# Patient Record
Sex: Male | Born: 1940 | Race: Black or African American | Hispanic: No | Smoking: Never smoker
Health system: Southern US, Community
[De-identification: ages and names within clinical notes are randomized; demographics above are authoritative.]

## PROBLEM LIST (undated history)

## (undated) DIAGNOSIS — M5136 Other intervertebral disc degeneration, lumbar region: Secondary | ICD-10-CM

## (undated) DIAGNOSIS — F419 Anxiety disorder, unspecified: Secondary | ICD-10-CM

## (undated) DIAGNOSIS — T8149XA Infection following a procedure, other surgical site, initial encounter: Secondary | ICD-10-CM

## (undated) DIAGNOSIS — E785 Hyperlipidemia, unspecified: Secondary | ICD-10-CM

## (undated) DIAGNOSIS — I10 Essential (primary) hypertension: Secondary | ICD-10-CM

## (undated) DIAGNOSIS — R931 Abnormal findings on diagnostic imaging of heart and coronary circulation: Secondary | ICD-10-CM

## (undated) HISTORY — DX: Hyperlipidemia, unspecified: E78.5

## (undated) HISTORY — DX: Anxiety disorder, unspecified: F41.9

## (undated) HISTORY — DX: Abnormal findings on diagnostic imaging of heart and coronary circulation: R93.1

## (undated) HISTORY — DX: Other intervertebral disc degeneration, lumbar region: M51.36

---

## 2006-04-27 HISTORY — PX: HERNIA REPAIR: SHX51

## 2007-04-14 ENCOUNTER — Other Ambulatory Visit: Payer: Self-pay

## 2007-04-14 ENCOUNTER — Inpatient Hospital Stay: Payer: Self-pay | Admitting: Vascular Surgery

## 2007-04-22 ENCOUNTER — Ambulatory Visit (HOSPITAL_COMMUNITY): Admission: RE | Admit: 2007-04-22 | Discharge: 2007-04-22 | Payer: Self-pay | Admitting: Internal Medicine

## 2007-05-06 ENCOUNTER — Ambulatory Visit (HOSPITAL_COMMUNITY): Admission: RE | Admit: 2007-05-06 | Discharge: 2007-05-06 | Payer: Self-pay | Admitting: Family Medicine

## 2007-05-07 ENCOUNTER — Ambulatory Visit (HOSPITAL_COMMUNITY): Admission: RE | Admit: 2007-05-07 | Discharge: 2007-05-07 | Payer: Self-pay | Admitting: *Deleted

## 2007-06-20 ENCOUNTER — Ambulatory Visit: Payer: Self-pay

## 2007-12-08 ENCOUNTER — Ambulatory Visit: Payer: Self-pay | Admitting: Vascular Surgery

## 2007-12-08 ENCOUNTER — Other Ambulatory Visit: Payer: Self-pay

## 2007-12-22 ENCOUNTER — Inpatient Hospital Stay: Payer: Self-pay | Admitting: Vascular Surgery

## 2008-07-20 IMAGING — CT CT ABCESS DRAINAGE
1 series · 16 of 32 positions shown, 20 images · non-contrast
Comparison: none

CLINICAL DATA: Postoperative change, right flank abscess.
 CT GUIDED RIGHT LOWER QUADRANT ABSCESS DRAIN (4344 hours):
 Procedure: The right lower quadrant was prepped and draped in a sterile fashion. Lidocaine was utilized for local anesthesia.  Under CT guidance, an 18-gauge needle was inserted into the right lower quadrant abscess. It was removed over an Amplatz.  A 12 French drain was inserted and coiled in the abscess cavity.  It was looped and string-fixed, then sewn to the skin.  5 cc frank pus was aspirated and no complications.

[Series 2: abd pelvis · axial · 0.86mm/px · z∈[-142,-2]mm · 16 of 69 slices shown, 20 images]
[im 5/69  soft-tissue]
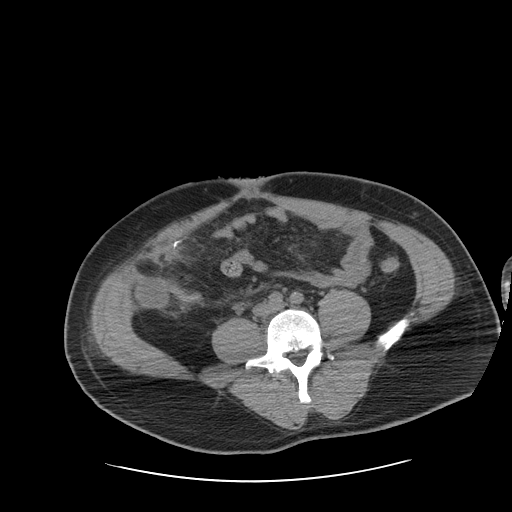
[im 5/69  bone]
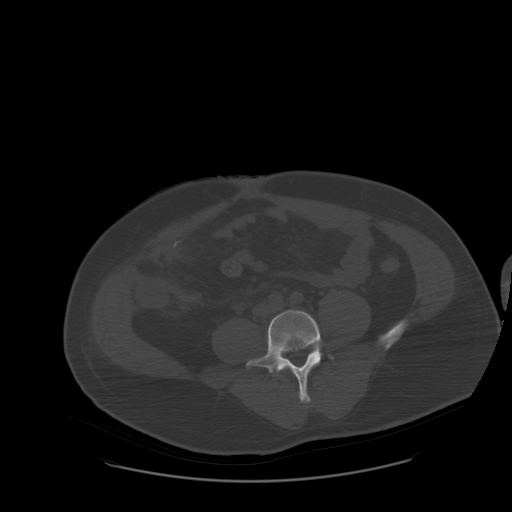
[im 9/69  soft-tissue]
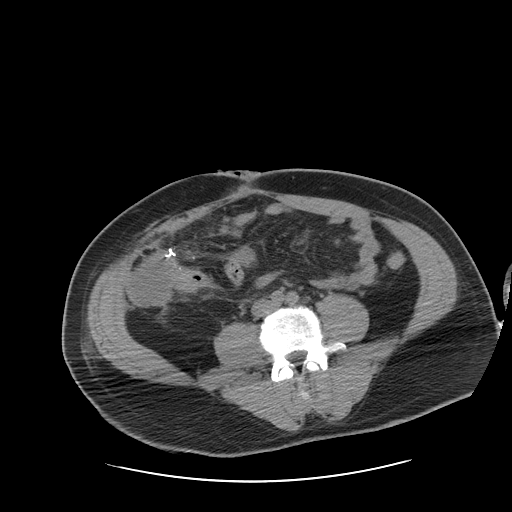
[im 14/69  soft-tissue]
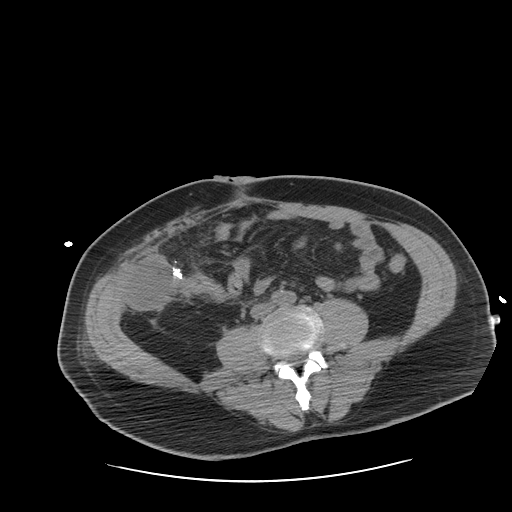
[im 18/69  soft-tissue]
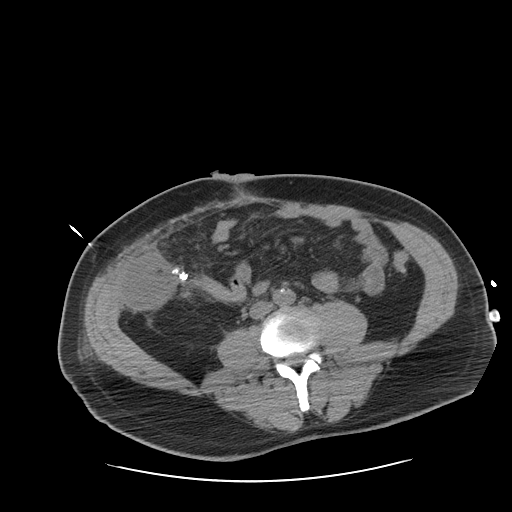
[im 22/69  soft-tissue]
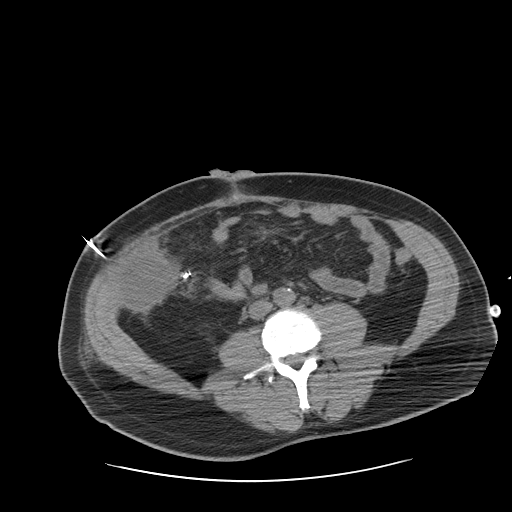
[im 27/69  soft-tissue]
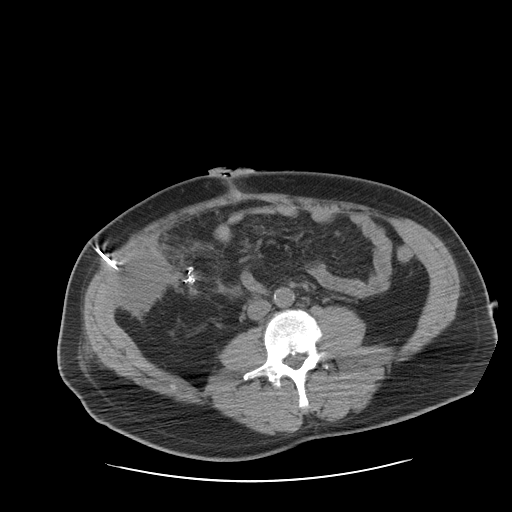
[im 31/69  soft-tissue]
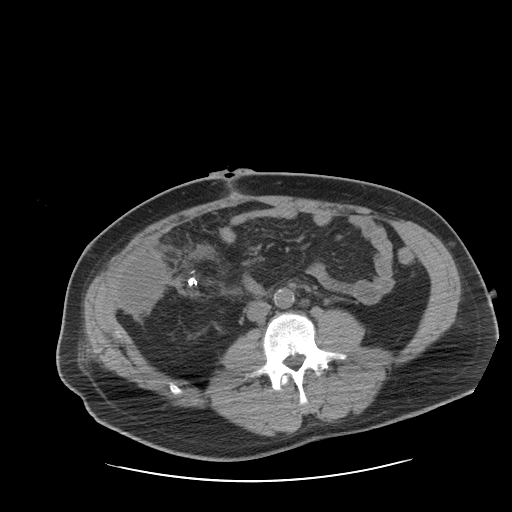
[im 38/69  soft-tissue]
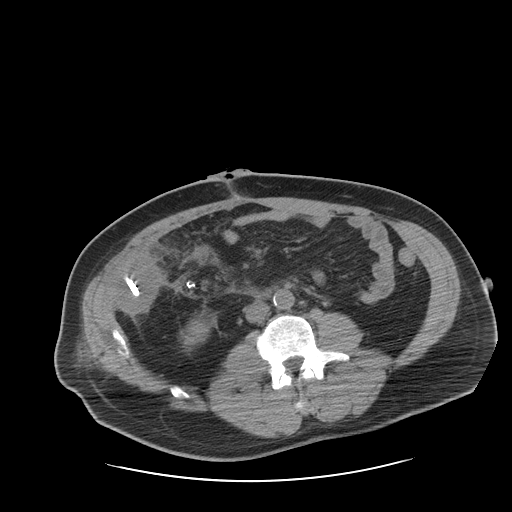
[im 42/69  soft-tissue]
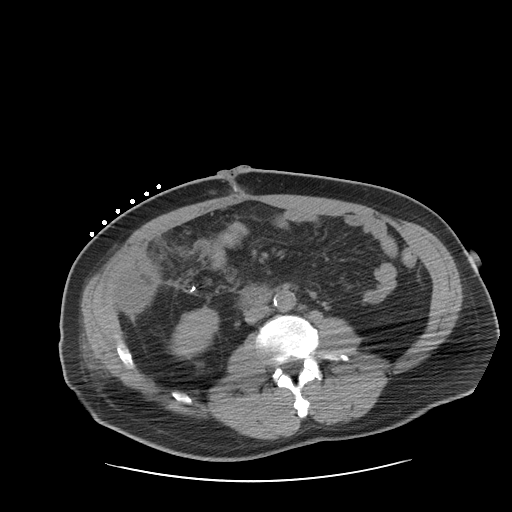
[im 42/69  bone]
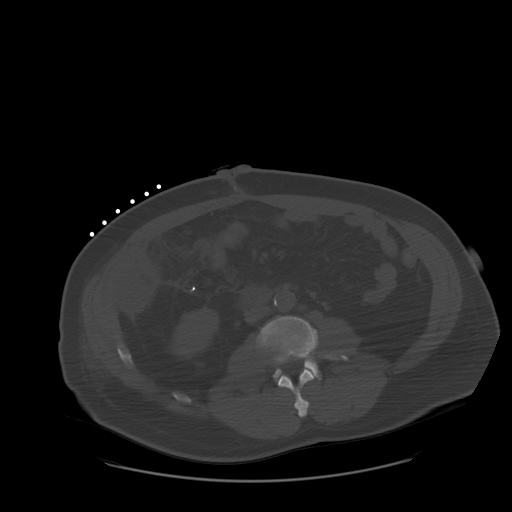
[im 47/69  soft-tissue]
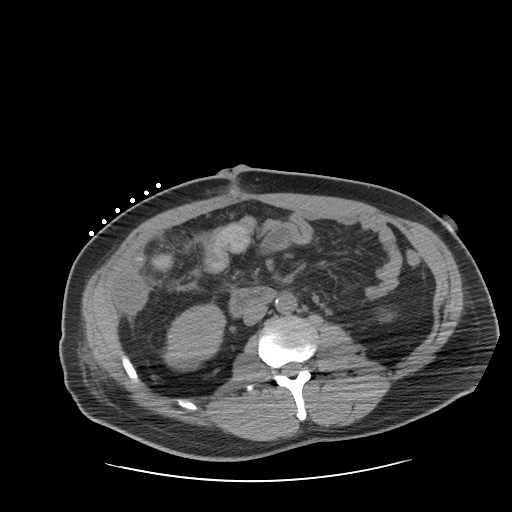
[im 51/69  soft-tissue]
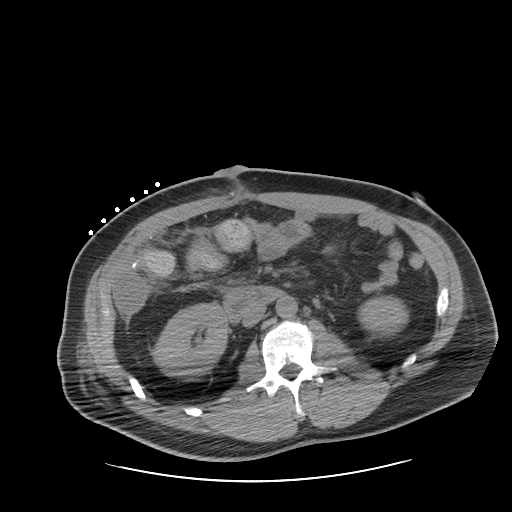
[im 55/69  soft-tissue]
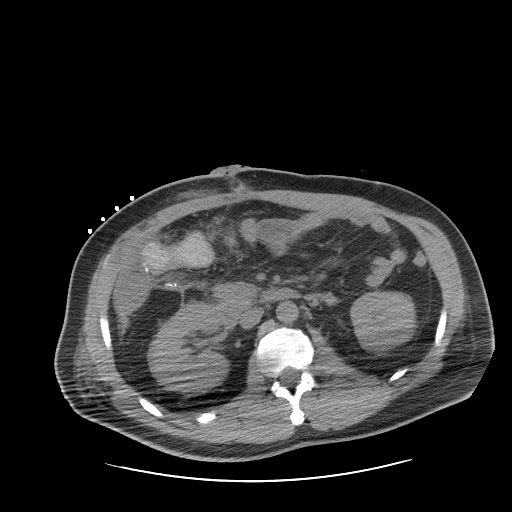
[im 60/69  soft-tissue]
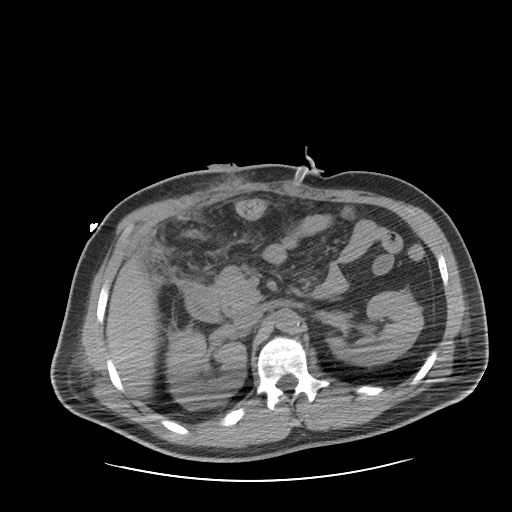
[im 60/69  lung]
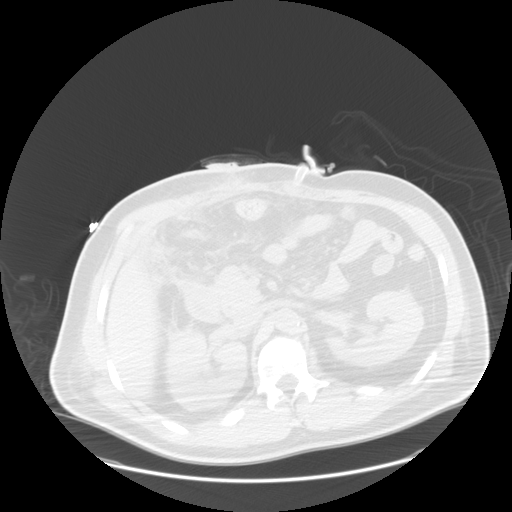
[im 62/69  lung]
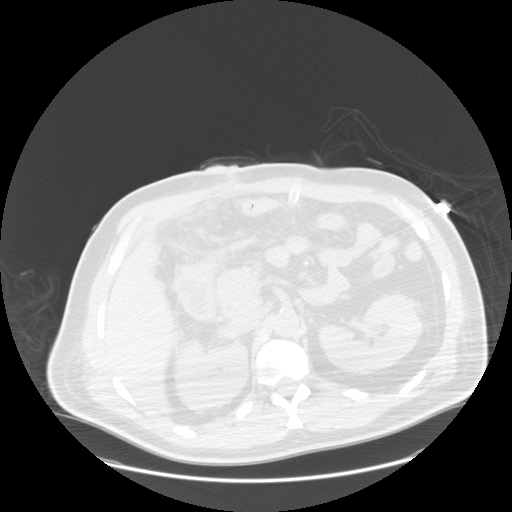
[im 64/69  soft-tissue]
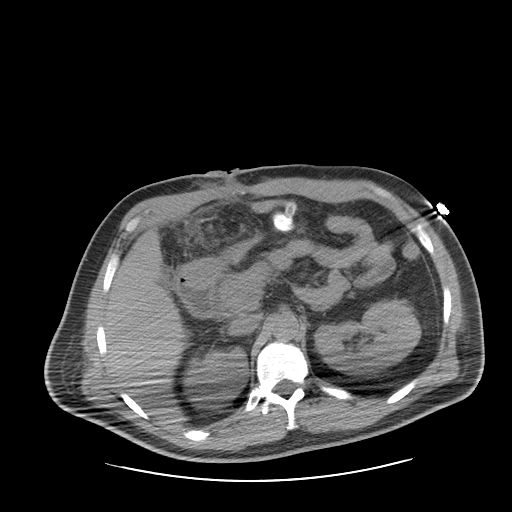
[im 64/69  lung]
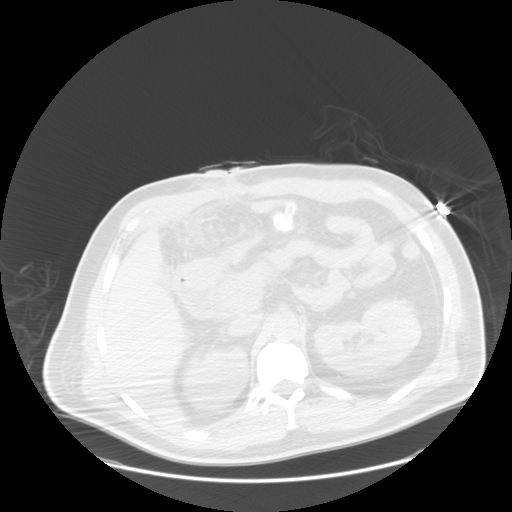
[im 66/69  lung]
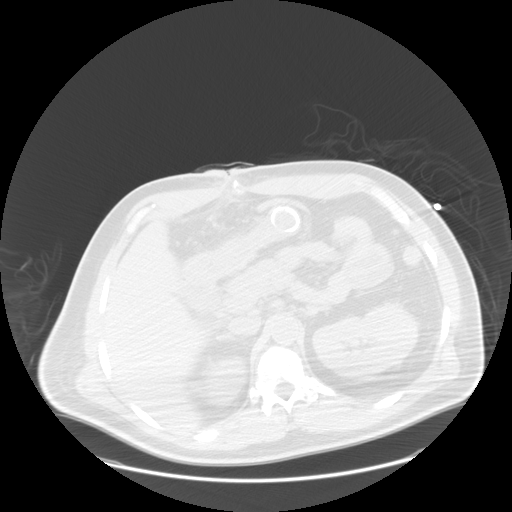

[16 of 32 positions shown; findings below may reference images not displayed]

FINDINGS: images document placement of a 12 French drain in a right lower quadrant abscess.
IMPRESSION: Successful right lower quadrant abscess drain.  The patient will be placed on ciprofloxacin IV.

## 2011-01-15 LAB — CULTURE, ROUTINE-ABSCESS: Culture: NO GROWTH

## 2011-02-19 ENCOUNTER — Ambulatory Visit: Payer: Self-pay | Admitting: Surgery

## 2011-03-03 ENCOUNTER — Ambulatory Visit: Payer: Self-pay | Admitting: Internal Medicine

## 2011-03-06 ENCOUNTER — Inpatient Hospital Stay: Payer: Self-pay | Admitting: Surgery

## 2012-05-22 IMAGING — CR DG ABDOMEN 2V
1 series · 2 of 2 positions shown · non-contrast
Comparison: none

REASON FOR EXAM: Ileus
COMMENTS:

[Series 1: view not recorded · 0.17mm/px · 2 of 2 slices shown]
[im 1/2]
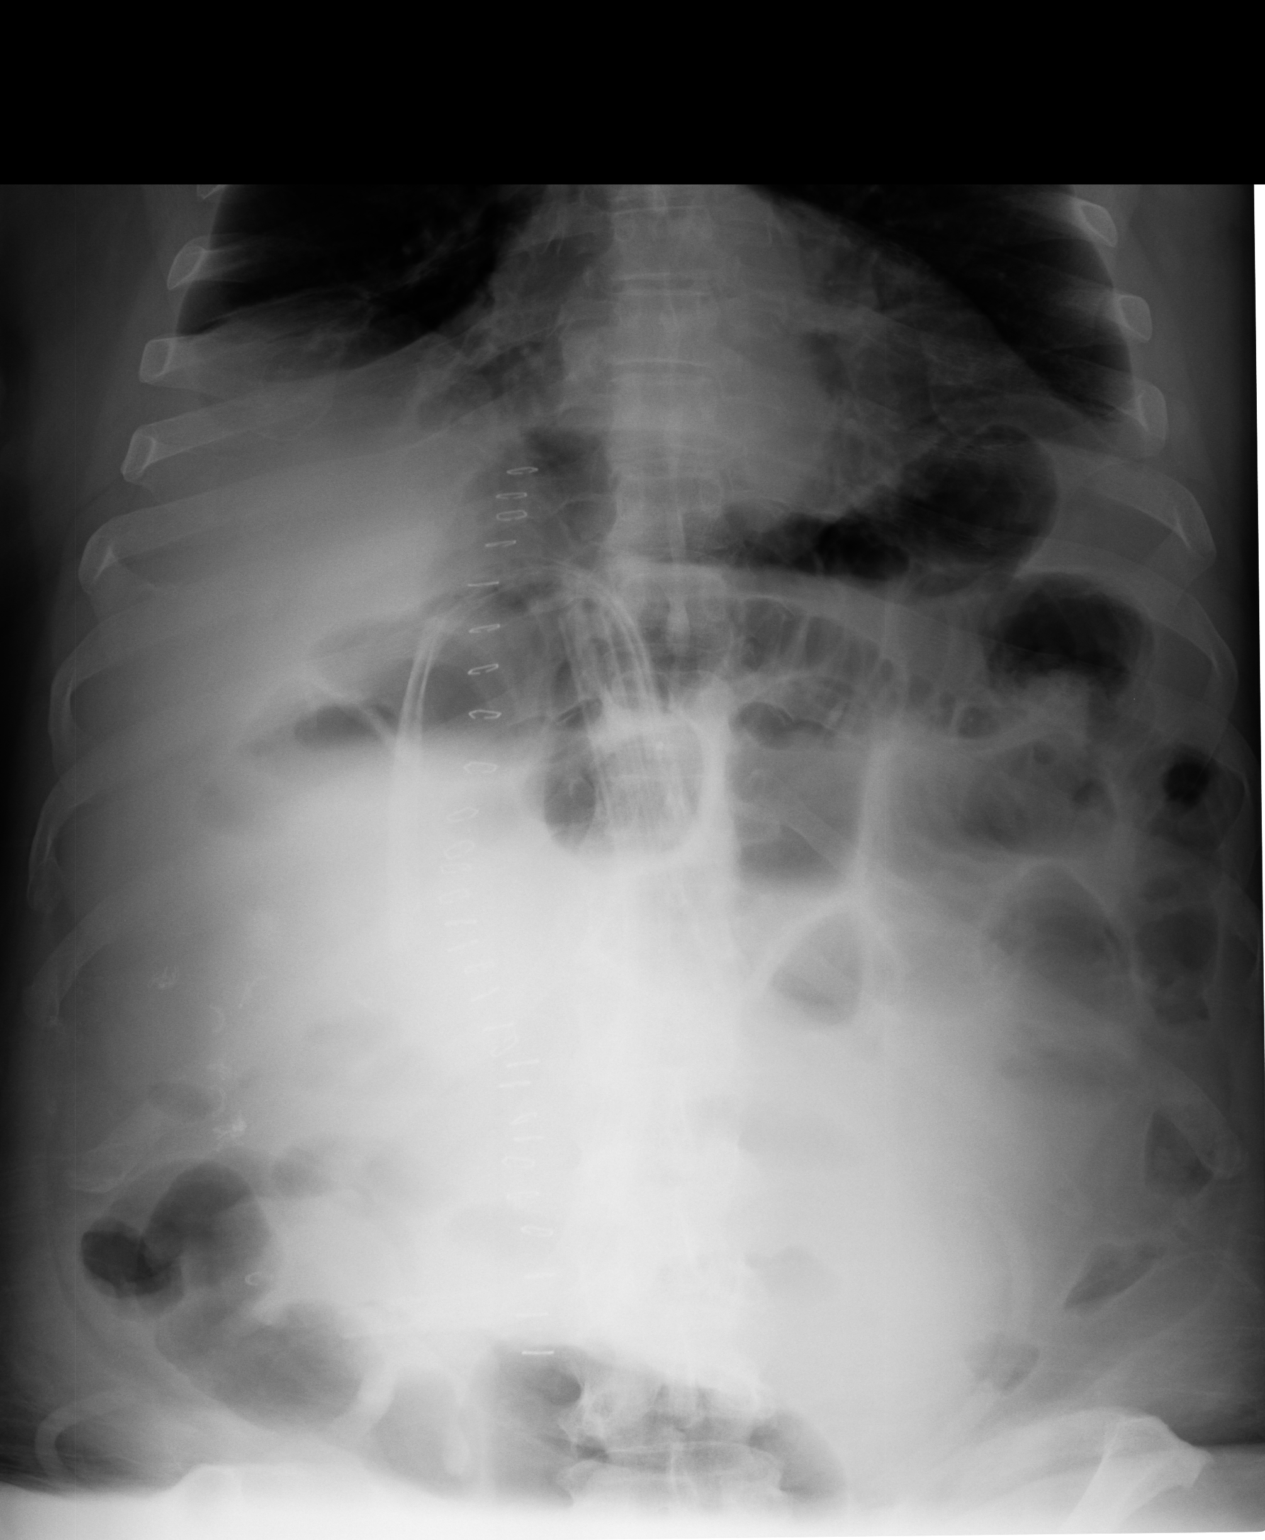
[im 2/2]
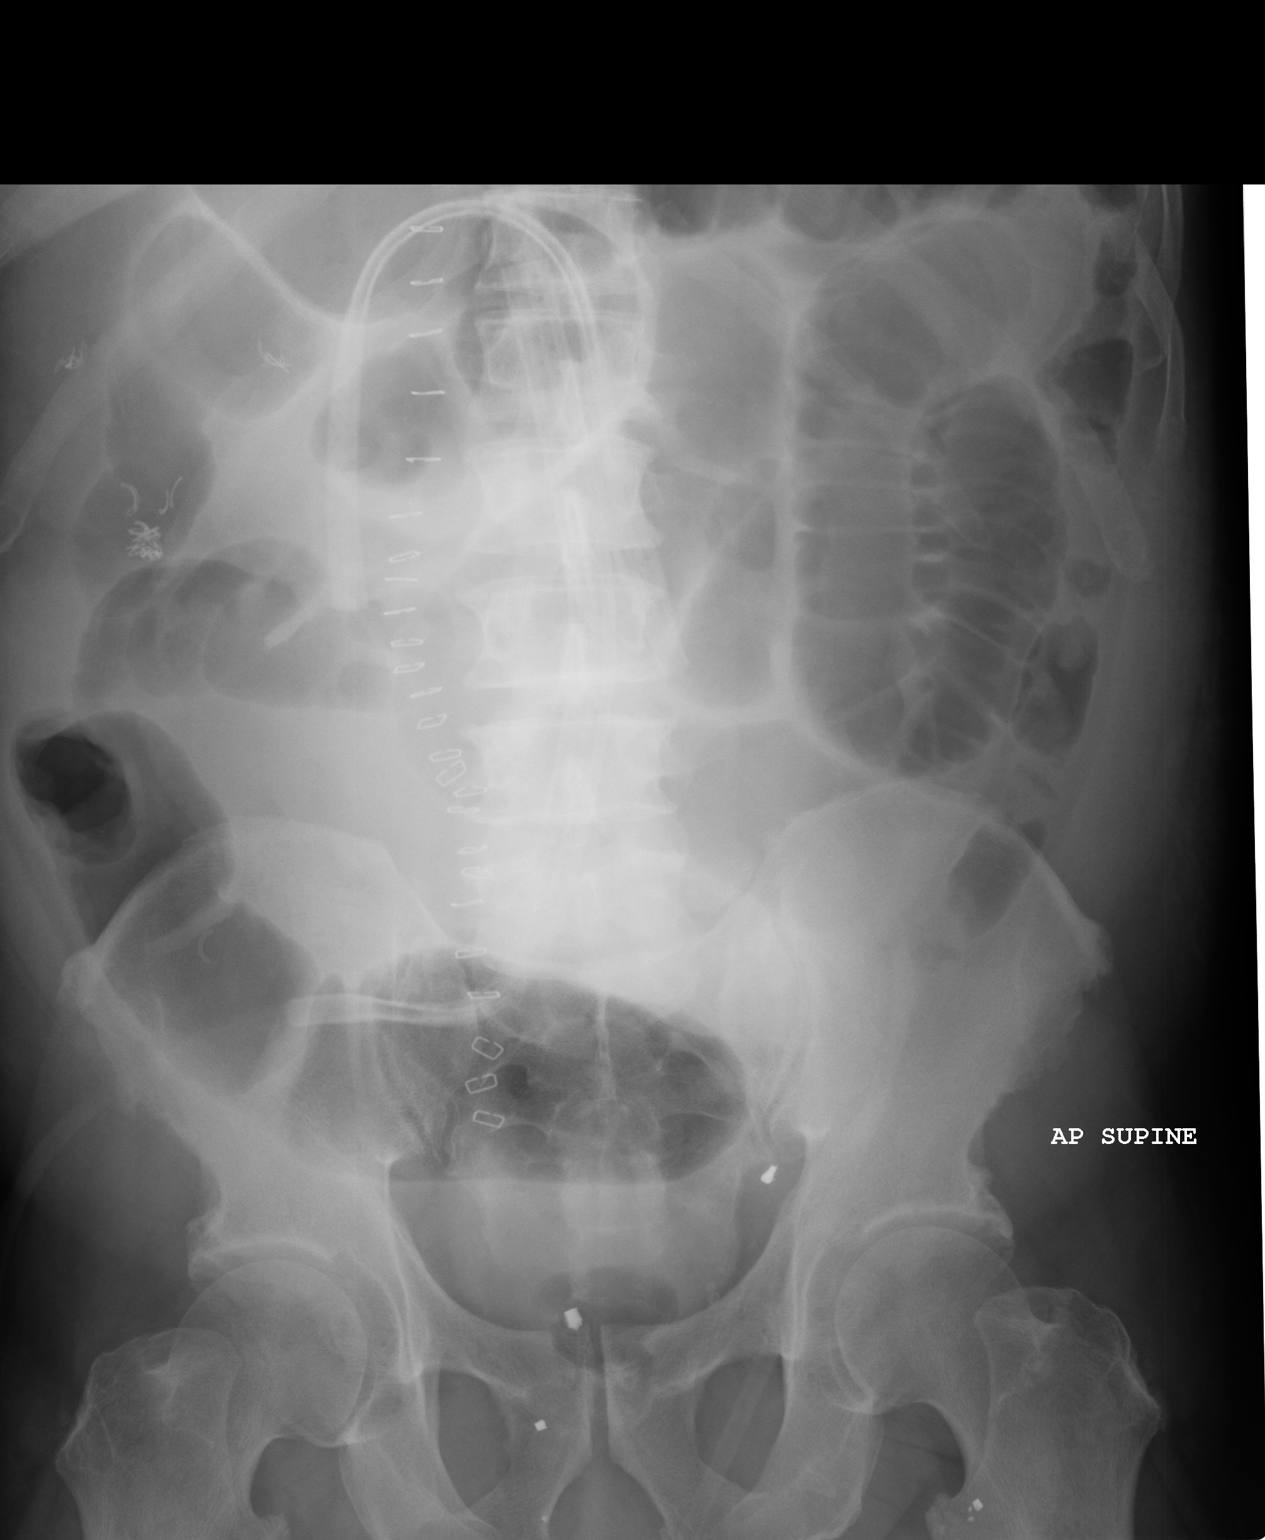

[2 of 2 positions shown; findings below may reference images not displayed]

PROCEDURE:     DXR - DXR ABDOMEN 2 V FLAT AND ERECT  - March 09, 2011  [DATE]

RESULT:     Postoperative metallic clips are noted to the right of midline.
No subdiaphragmatic free air is seen. There is gaseous distention of
multiple loops of small bowel, most compatible with an ileus pattern. Gas is
present in the colon. There is tubing and a possible drain projected over
the upper abdomen. Postoperative metallic clips are present laterally in the
right abdomen.
IMPRESSION: 1. Postoperative changes are noted as mentioned above.
2. There is mild dilatation of multiple loops of small bowel, most
compatible with an ileus pattern.

## 2013-09-20 DIAGNOSIS — M5136 Other intervertebral disc degeneration, lumbar region: Secondary | ICD-10-CM | POA: Insufficient documentation

## 2013-09-20 DIAGNOSIS — E785 Hyperlipidemia, unspecified: Secondary | ICD-10-CM

## 2013-09-20 DIAGNOSIS — I10 Essential (primary) hypertension: Secondary | ICD-10-CM | POA: Insufficient documentation

## 2013-09-20 HISTORY — DX: Other intervertebral disc degeneration, lumbar region: M51.36

## 2013-09-20 HISTORY — DX: Hyperlipidemia, unspecified: E78.5

## 2013-11-12 ENCOUNTER — Emergency Department: Payer: Self-pay | Admitting: Emergency Medicine

## 2014-04-27 DIAGNOSIS — R931 Abnormal findings on diagnostic imaging of heart and coronary circulation: Secondary | ICD-10-CM

## 2014-04-27 HISTORY — DX: Abnormal findings on diagnostic imaging of heart and coronary circulation: R93.1

## 2014-05-06 ENCOUNTER — Emergency Department: Payer: Self-pay | Admitting: Emergency Medicine

## 2014-05-06 LAB — CBC WITH DIFFERENTIAL/PLATELET
BASOS ABS: 0 10*3/uL (ref 0.0–0.1)
Basophil %: 0.5 %
EOS PCT: 1.5 %
Eosinophil #: 0.1 10*3/uL (ref 0.0–0.7)
HCT: 44.7 % (ref 40.0–52.0)
HGB: 14.5 g/dL (ref 13.0–18.0)
LYMPHS ABS: 1.6 10*3/uL (ref 1.0–3.6)
Lymphocyte %: 27.2 %
MCH: 29.6 pg (ref 26.0–34.0)
MCHC: 32.4 g/dL (ref 32.0–36.0)
MCV: 91 fL (ref 80–100)
MONO ABS: 0.7 x10 3/mm (ref 0.2–1.0)
Monocyte %: 11.9 %
NEUTROS PCT: 58.9 %
Neutrophil #: 3.5 10*3/uL (ref 1.4–6.5)
PLATELETS: 271 10*3/uL (ref 150–440)
RBC: 4.89 10*6/uL (ref 4.40–5.90)
RDW: 13.1 % (ref 11.5–14.5)
WBC: 5.9 10*3/uL (ref 3.8–10.6)

## 2014-05-06 LAB — BASIC METABOLIC PANEL
Anion Gap: 6 — ABNORMAL LOW (ref 7–16)
BUN: 13 mg/dL (ref 7–18)
CHLORIDE: 98 mmol/L (ref 98–107)
CO2: 35 mmol/L — AB (ref 21–32)
CREATININE: 0.85 mg/dL (ref 0.60–1.30)
Calcium, Total: 9.4 mg/dL (ref 8.5–10.1)
EGFR (African American): >60
EGFR (Non-African Amer.): >60
Glucose: 109 mg/dL — ABNORMAL HIGH (ref 65–99)
Osmolality: 278 (ref 275–301)
Potassium: 3.5 mmol/L (ref 3.5–5.1)
SODIUM: 139 mmol/L (ref 136–145)

## 2014-12-19 ENCOUNTER — Emergency Department
Admission: EM | Admit: 2014-12-19 | Discharge: 2014-12-19 | Disposition: A | Discharge status: Home / Self Care | Payer: Medicare Other | Attending: Emergency Medicine | Admitting: Emergency Medicine

## 2014-12-19 ENCOUNTER — Encounter: Payer: Self-pay | Admitting: Emergency Medicine

## 2014-12-19 DIAGNOSIS — F329 Major depressive disorder, single episode, unspecified: Secondary | ICD-10-CM

## 2014-12-19 DIAGNOSIS — F32A Depression, unspecified: Secondary | ICD-10-CM

## 2014-12-19 DIAGNOSIS — N400 Enlarged prostate without lower urinary tract symptoms: Secondary | ICD-10-CM | POA: Diagnosis not present

## 2014-12-19 DIAGNOSIS — F419 Anxiety disorder, unspecified: Secondary | ICD-10-CM | POA: Diagnosis not present

## 2014-12-19 DIAGNOSIS — I10 Essential (primary) hypertension: Secondary | ICD-10-CM | POA: Insufficient documentation

## 2014-12-19 DIAGNOSIS — R309 Painful micturition, unspecified: Secondary | ICD-10-CM | POA: Diagnosis present

## 2014-12-19 HISTORY — DX: Essential (primary) hypertension: I10

## 2014-12-19 LAB — URINALYSIS COMPLETE WITH MICROSCOPIC (ARMC ONLY)
BILIRUBIN URINE: NEGATIVE
Bacteria, UA: NONE SEEN
Glucose, UA: NEGATIVE mg/dL
HGB URINE DIPSTICK: NEGATIVE
KETONES UR: NEGATIVE mg/dL
LEUKOCYTES UA: NEGATIVE
NITRITE: NEGATIVE
PH: 7 (ref 5.0–8.0)
Protein, ur: NEGATIVE mg/dL
RBC / HPF: NONE SEEN RBC/hpf (ref 0–5)
Specific Gravity, Urine: 1.004 — ABNORMAL LOW (ref 1.005–1.030)
Squamous Epithelial / LPF: NONE SEEN
WBC, UA: NONE SEEN WBC/hpf (ref 0–5)

## 2014-12-19 LAB — COMPREHENSIVE METABOLIC PANEL
ALT: 21 U/L (ref 17–63)
ANION GAP: 9 (ref 5–15)
AST: 30 U/L (ref 15–41)
Albumin: 4.8 g/dL (ref 3.5–5.0)
Alkaline Phosphatase: 54 U/L (ref 38–126)
BILIRUBIN TOTAL: 1.8 mg/dL — AB (ref 0.3–1.2)
BUN: 9 mg/dL (ref 6–20)
CO2: 30 mmol/L (ref 22–32)
Calcium: 9.7 mg/dL (ref 8.9–10.3)
Chloride: 100 mmol/L — ABNORMAL LOW (ref 101–111)
Creatinine, Ser: 0.89 mg/dL (ref 0.61–1.24)
GFR calc Af Amer: >60 mL/min (ref >60)
Glucose, Bld: 125 mg/dL — ABNORMAL HIGH (ref 65–99)
POTASSIUM: 3.4 mmol/L — AB (ref 3.5–5.1)
Sodium: 139 mmol/L (ref 135–145)
TOTAL PROTEIN: 8.3 g/dL — AB (ref 6.5–8.1)

## 2014-12-19 LAB — CBC
HEMATOCRIT: 45.6 % (ref 40.0–52.0)
Hemoglobin: 14.8 g/dL (ref 13.0–18.0)
MCH: 28.9 pg (ref 26.0–34.0)
MCHC: 32.4 g/dL (ref 32.0–36.0)
MCV: 89.1 fL (ref 80.0–100.0)
Platelets: 259 10*3/uL (ref 150–440)
RBC: 5.12 MIL/uL (ref 4.40–5.90)
RDW: 13 % (ref 11.5–14.5)
WBC: 5.5 10*3/uL (ref 3.8–10.6)

## 2014-12-19 MED ORDER — TAMSULOSIN HCL 0.4 MG PO CAPS: 0.4000 mg | ORAL_CAPSULE | Freq: Every day | ORAL | Status: DC

## 2014-12-19 MED ORDER — DIAZEPAM 5 MG PO TABS
5.0000 mg | ORAL_TABLET | Freq: Once | ORAL | Status: AC
  Administered 2014-12-19: 5 mg via ORAL

## 2014-12-19 MED ORDER — CLONAZEPAM 2 MG PO TABS: 2.0000 mg | ORAL_TABLET | Freq: Two times a day (BID) | ORAL | Status: DC | PRN

## 2014-12-19 MED ORDER — PAROXETINE HCL 10 MG PO TABS: 10.0000 mg | ORAL_TABLET | Freq: Every day | ORAL | Status: DC

## 2014-12-19 MED ORDER — DIAZEPAM 5 MG PO TABS
ORAL_TABLET | ORAL | Status: AC
  Administered 2014-12-19: 5 mg via ORAL
  Filled 2014-12-19: qty 1

## 2014-12-19 NOTE — ED Provider Notes (Signed)
[facility] Emergency Department Provider Note     Time seen: ----------------------------------------- 4:54 PM on 12/19/2014 -----------------------------------------    I have reviewed the triage vital signs and the nursing notes.   HISTORY  Chief Complaint Anxiety    HPI Stanley Baxter. is a 74 y.o. male who presents to ER states he is currently being treated for UTI and since this past week and is having and jittery and nervous feeling. Patient notes that really has been going on for the past month where he feels weak a little off balance and he still having painful urination. Patient is accompanied by a friend who advised that she saw him at the store and seemed confused. He states he has taken all but one of the antibiotics   Past Medical History  Diagnosis Date  . Hypertension     There are no active problems to display for this patient.   Past Surgical History  Procedure Laterality Date  . Hernia repair      Allergies Review of patient's allergies indicates no known allergies.  Social History Social History  Substance Use Topics  . Smoking status: Never Smoker   . Smokeless tobacco: None  . Alcohol Use: No    Review of Systems Constitutional: Negative for fever. Eyes: Negative for visual changes. ENT: Negative for sore throat. Cardiovascular: Negative for chest pain. Respiratory: Negative for shortness of breath. Gastrointestinal: Negative for abdominal pain, vomiting and diarrhea. Genitourinary: Negative for dysuria. Musculoskeletal: Negative for back pain. Skin: Negative for rash. Neurological: Negative for headaches, positive for weakness and balance trouble Psychiatric: Positive for anxiety and nervousness  10-point ROS otherwise negative.  ____________________________________________   PHYSICAL EXAM:  VITAL SIGNS: ED Triage Vitals  Enc Vitals Group     BP 12/19/14 1139 142/64 mmHg     Pulse Rate  12/19/14 1139 80     Resp 12/19/14 1139 18     Temp 12/19/14 1139 98.7 F (37.1 C)     Temp Source 12/19/14 1139 Oral     SpO2 12/19/14 1139 99 %     Weight 12/19/14 1139 190 lb (86.183 kg)     Height 12/19/14 1139  (1.651 m)     Head Cir --      Peak Flow --      Pain Score --      Pain Loc --      Pain Edu? --      Excl. in GC? --     Constitutional: Alert and oriented. Well appearing and in no distress. Eyes: Conjunctivae are normal. PERRL. Normal extraocular movements. ENT   Head: Normocephalic and atraumatic.   Nose: No congestion/rhinnorhea.   Mouth/Throat: Mucous membranes are moist.   Neck: No stridor. Cardiovascular: Normal rate, regular rhythm. Normal and symmetric distal pulses are present in all extremities. No murmurs, rubs, or gallops. Respiratory: Normal respiratory effort without tachypnea nor retractions. Breath sounds are clear and equal bilaterally. No wheezes/rales/rhonchi. Gastrointestinal: Soft and nontender. No distention. No abdominal bruits.  Musculoskeletal: Nontender with normal range of motion in all extremities. No joint effusions.  No lower extremity tenderness nor edema. Neurologic:  Normal speech and language. No gross focal neurologic deficits are appreciated. Speech is normal. No gait instability. Skin:  Skin is warm, dry and intact. No rash noted. Psychiatric: Depressed mood and affect.  ____________________________________________  ED COURSE:  Pertinent labs & imaging results that were available during my care of the patient were reviewed by me and  considered in my medical decision making (see chart for details). We'll check basic labs, patient will need a medication adjustment and psychiatric follow-up. ____________________________________________    LABS (pertinent positives/negatives)  Labs Reviewed  URINALYSIS COMPLETEWITH MICROSCOPIC ([facility] ONLY) - Abnormal; Notable for the following:    Color, Urine STRAW (*)     APPearance CLEAR (*)    Specific Gravity, Urine 1.004 (*)    All other components within normal limits  COMPREHENSIVE METABOLIC PANEL - Abnormal; Notable for the following:    Potassium 3.4 (*)    Chloride 100 (*)    Glucose, Bld 125 (*)    Total Protein 8.3 (*)    Total Bilirubin 1.8 (*)    All other components within normal limits  CBC   ____________________________________________  FINAL ASSESSMENT AND PLAN  Anxiety and depression, BPH  Plan: Patient with labs and imaging as dictated above. Patient with anxiety and depression. No current UTI. Will be placed on Paxil and will increase his Klonopin as needed. He is encouraged to have follow-up with his doctor the next week for reevaluation. He also seems to have some BPH as he describes urinary hesitancy. Will place on Flomax as well.   Emily Filbert, MD   Emily Filbert, MD 12/19/14 [phone]

## 2014-12-19 NOTE — ED Notes (Addendum)
Pt states he is currently being treated for a UTI, states since this past weekend he has been having "jittering and nervous feeling", also states he is still having some lower back pain, denies any painful urination, pt is accompanied by friend and friend advised that she saw pt at the store and he was shaky and crying and seemed to be confused

## 2014-12-19 NOTE — Discharge Instructions (Signed)
Benign Prostatic Hyperplasia An enlarged prostate (benign prostatic hyperplasia) is common in older men. You may experience the following:  Weak urine stream.  Dribbling.  Feeling like the bladder has not emptied completely.  Difficulty starting urination.  Getting up frequently at night to urinate.  Urinating more frequently during the day. HOME CARE INSTRUCTIONS  Monitor your prostatic hyperplasia for any changes. The following actions may help to alleviate any discomfort you are experiencing:  Give yourself time when you urinate.  Stay away from alcohol.  Avoid beverages containing caffeine, such as coffee, tea, and colas, because they can make the problem worse.  Avoid decongestants, antihistamines, and some prescription medicines that can make the problem worse.  Follow up with your health care provider for further treatment as recommended. SEEK MEDICAL CARE IF:  You are experiencing progressive difficulty voiding.  Your urine stream is progressively getting narrower.  You are awaking from sleep with the urge to void more frequently.  You are constantly feeling the need to void.  You experience loss of urine, especially in small amounts. SEEK IMMEDIATE MEDICAL CARE IF:   You develop increased pain with urination or are unable to urinate.  You develop severe abdominal pain, vomiting, a high fever, or fainting.  You develop back pain or blood in your urine. MAKE SURE YOU:   Understand these instructions.  Will watch your condition.  Will get help right away if you are not doing well or get worse. Document Released: 04/13/2005 Document Revised: 12/14/2012 Document Reviewed: 09/13/2012 [facility] Patient Information 2015 [place], [place]. This information is not intended to replace advice given to you by your health care provider. Make sure you discuss any questions you have with your health care provider.  Depression Depression is feeling sad, low, down in the  dumps, blue, gloomy, or empty. In general, there are two kinds of depression:  Normal sadness or grief. This can happen after something upsetting. It often goes away on its own within 2 weeks. After losing a loved one (bereavement), normal sadness and grief may last longer than two weeks. It usually gets better with time.  Clinical depression. This kind lasts longer than normal sadness or grief. It keeps you from doing the things you normally do in life. It is often hard to function at home, work, or at school. It may affect your relationships with others. Treatment is often needed. GET HELP RIGHT AWAY IF:  You have thoughts about hurting yourself or others.  You lose touch with reality (psychotic symptoms). You may:  See or hear things that are not real.  Have untrue beliefs about your life or people around you.  Your medicine is giving you problems. MAKE SURE YOU:  Understand these instructions.  Will watch your condition.  Will get help right away if you are not doing well or get worse. Document Released: 05/16/2010 Document Revised: 08/28/2013 Document Reviewed: 08/13/2011 [facility] Patient Information 2015 [place], [place]. This information is not intended to replace advice given to you by your health care provider. Make sure you discuss any questions you have with your health care provider.

## 2015-10-03 ENCOUNTER — Emergency Department: Payer: Medicare Other

## 2015-10-03 ENCOUNTER — Encounter: Payer: Self-pay | Admitting: *Deleted

## 2015-10-03 ENCOUNTER — Emergency Department
Admission: EM | Admit: 2015-10-03 | Discharge: 2015-10-03 | Disposition: A | Discharge status: Home / Self Care | Payer: Medicare Other | Attending: Emergency Medicine | Admitting: Emergency Medicine

## 2015-10-03 DIAGNOSIS — Z79899 Other long term (current) drug therapy: Secondary | ICD-10-CM | POA: Diagnosis not present

## 2015-10-03 DIAGNOSIS — I10 Essential (primary) hypertension: Secondary | ICD-10-CM | POA: Diagnosis not present

## 2015-10-03 DIAGNOSIS — J189 Pneumonia, unspecified organism: Secondary | ICD-10-CM | POA: Diagnosis not present

## 2015-10-03 DIAGNOSIS — R0789 Other chest pain: Secondary | ICD-10-CM | POA: Diagnosis present

## 2015-10-03 LAB — BASIC METABOLIC PANEL
ANION GAP: 6 (ref 5–15)
BUN: 15 mg/dL (ref 6–20)
CALCIUM: 8.9 mg/dL (ref 8.9–10.3)
CO2: 30 mmol/L (ref 22–32)
Chloride: 103 mmol/L (ref 101–111)
Creatinine, Ser: 0.88 mg/dL (ref 0.61–1.24)
GLUCOSE: 108 mg/dL — AB (ref 65–99)
Potassium: 4.3 mmol/L (ref 3.5–5.1)
Sodium: 139 mmol/L (ref 135–145)

## 2015-10-03 LAB — CBC
HEMATOCRIT: 41.5 % (ref 40.0–52.0)
HEMOGLOBIN: 13.7 g/dL (ref 13.0–18.0)
MCH: 30 pg (ref 26.0–34.0)
MCHC: 33 g/dL (ref 32.0–36.0)
MCV: 91.1 fL (ref 80.0–100.0)
Platelets: 227 10*3/uL (ref 150–440)
RBC: 4.56 MIL/uL (ref 4.40–5.90)
RDW: 13.5 % (ref 11.5–14.5)
WBC: 5.5 10*3/uL (ref 3.8–10.6)

## 2015-10-03 LAB — TROPONIN I: Troponin I: <0.03 ng/mL (ref <0.031)

## 2015-10-03 MED ORDER — DOXYCYCLINE HYCLATE 100 MG PO TABS
100.0000 mg | ORAL_TABLET | Freq: Once | ORAL | Status: AC
  Administered 2015-10-03: 100 mg via ORAL
  Filled 2015-10-03: qty 1

## 2015-10-03 NOTE — ED Notes (Signed)
Pt complains of chest tightness starting yesterday, pt reports vomiting yesterday and last night

## 2015-10-03 NOTE — Discharge Instructions (Signed)
Community-Acquired Pneumonia, Adult Pneumonia is an infection of the lungs. One type of pneumonia can happen while a person is in a hospital. A different type can happen when a person is not in a hospital (community-acquired pneumonia). It is easy for this kind to spread from person to person. It can spread to you if you breathe near an infected person who coughs or sneezes. Some symptoms include:  A dry cough.  A wet (productive) cough.  Fever.  Sweating.  Chest pain. HOME CARE  Take over-the-counter and prescription medicines only as told by your doctor.  Only take cough medicine if you are losing sleep.  If you were prescribed an antibiotic medicine, take it as told by your doctor. Do not stop taking the antibiotic even if you start to feel better.  Sleep with your head and neck raised (elevated). You can do this by putting a few pillows under your head, or you can sleep in a recliner.  Do not use tobacco products. These include cigarettes, chewing tobacco, and e-cigarettes. If you need help quitting, ask your doctor.  Drink enough water to keep your pee (urine) clear or pale yellow. A shot (vaccine) can help prevent pneumonia. Shots are often suggested for:  People older than 74 years of age.  People older than 74 years of age:  Who are having cancer treatment.  Who have long-term (chronic) lung disease.  Who have problems with their body's defense system (immune system). You may also prevent pneumonia if you take these actions:  Get the flu (influenza) shot every year.  Go to the dentist as often as told.  Wash your hands often. If soap and water are not available, use hand sanitizer. GET HELP IF:  You have a fever.  You lose sleep because your cough medicine does not help. GET HELP RIGHT AWAY IF:  You are short of breath and it gets worse.  You have more chest pain.  Your sickness gets worse. This is very serious if:  You are an older adult.  Your  body's defense system is weak.  You cough up blood.   This information is not intended to replace advice given to you by your health care provider. Make sure you discuss any questions you have with your health care provider.   Document Released: 09/30/2007 Document Revised: 01/02/2015 Document Reviewed: 08/08/2014 Elsevier Interactive Patient Education 2016 [employer]  Use the albuterol inhaler 2 puffs 4 times a day with a spacer until your cough is better.  Try the doxycycline antibiotic one pill twice a day for 10 days.  [facility] and increasing cough or shortness of breath or feeling sicker. Have your doctor check on you again in 2-3 days to see how you're doing.

## 2015-10-03 NOTE — ED Provider Notes (Signed)
[facility] Emergency Department Deuntae Kocsis Note   ____________________________________________  Time seen: Approximately 8:46 AM  I have reviewed the triage vital signs and the nursing notes.   HISTORY  Chief Complaint Chest Pain    HPI Even Budlong. is a 75 y.o. male complains of chest tightness since yesterday. He was recently diagnosed with pneumonia at the [place] and treated with Z-Pak but his chest is becoming somewhat tightness coughing up thick yellow phlegm. Not running a fever. The chest tightness been fairly constant since it started. Made worse by anything particularly.   Past Medical History  Diagnosis Date  . Hypertension     There are no active problems to display for this patient.   Past Surgical History  Procedure Laterality Date  . Hernia repair      Current Outpatient Rx  Name  Route  Sig  Dispense  Refill  . clonazePAM (KLONOPIN) 2 MG tablet   Oral   Take 1 tablet (2 mg total) by mouth 2 (two) times daily as needed for anxiety.   20 tablet   0   . lisinopril (PRINIVIL,ZESTRIL) 20 MG tablet   Oral   Take 10 mg by mouth daily.         . pregabalin (LYRICA) 150 MG capsule   Oral   Take 150 mg by mouth 2 (two) times daily.           Allergies Review of patient's allergies indicates no known allergies.  No family history on file.  Social History Social History  Substance Use Topics  . Smoking status: Never Smoker   . Smokeless tobacco: None  . Alcohol Use: No    Review of Systems Constitutional: No fever/chills Eyes: No visual changes. ENT: No sore throat. Cardiovascular: See history of present illness Respiratory: Denies shortness of breath. Gastrointestinal: No abdominal pain.  No nausea, no vomiting.  No diarrhea.  No constipation. Genitourinary: Negative for dysuria. Musculoskeletal: Negative for back pain. Skin: Negative for rash. Neurological: Negative for headaches, focal weakness or  numbness.  10-point ROS otherwise negative.  ____________________________________________   PHYSICAL EXAM:  VITAL SIGNS: ED Triage Vitals  Enc Vitals Group     BP 10/03/15 0715 138/73 mmHg     Pulse Rate 10/03/15 0715 78     Resp 10/03/15 0715 18     Temp 10/03/15 0715 98 F (36.7 C)     Temp Source 10/03/15 0715 Oral     SpO2 10/03/15 0715 97 %     Weight 10/03/15 0715 190 lb (86.183 kg)     Height 10/03/15 0715  (1.651 m)     Head Cir --      Peak Flow --      Pain Score 10/03/15 0715 2     Pain Loc --      Pain Edu? --      Excl. in GC? --    Constitutional: Alert and oriented. Well appearing and in no acute distress. Eyes: Conjunctivae are normal. PERRL. EOMI. Head: Atraumatic. Nose: No congestion/rhinnorhea. Mouth/Throat: Mucous membranes are moist.  Oropharynx non-erythematous. Neck: No stridor.  Cardiovascular: Normal rate, regular rhythm. Grossly normal heart sounds.  Good peripheral circulation. Respiratory: Normal respiratory effort.  No retractions. Lungs CTAB. Gastrointestinal: Soft and nontender. No distention. No abdominal bruits. No CVA tenderness. }Musculoskeletal: No lower extremity tenderness nor edema.  No joint effusions. Neurologic:  Normal speech and language. No gross focal neurologic deficits are appreciated. No gait instability. Skin:  Skin is warm, dry and intact. No rash noted. Psychiatric: Mood and affect are normal. Speech and behavior are normal.  ____________________________________________   LABS (all labs ordered are listed, but only abnormal results are displayed)  Labs Reviewed  BASIC METABOLIC PANEL - Abnormal; Notable for the following:    Glucose, Bld 108 (*)    All other components within normal limits  CBC  TROPONIN I  TROPONIN I   ____________________________________________  EKG  KG read and interpreted by me shows normal sinus rhythm rate of 79 left axis no acute ST-T wave  changes ____________________________________________  RADIOLOGY  Chest x-ray did not show any infiltrate per radiology CT showed a left lateral basilar ____________________________________________   PROCEDURES  ____________________________________________   INITIAL IMPRESSION / ASSESSMENT AND PLAN / ED COURSE  Pertinent labs & imaging results that were available during my care of the patient were reviewed by me and considered in my medical decision making (see chart for details).  Patient seems to fail treatment with Z-Pak. We'll try doxycycline. [facility] ____________________________________________   FINAL CLINICAL IMPRESSION(S) / ED DIAGNOSES  Final diagnoses:  Community acquired pneumonia      NEW MEDICATIONS STARTED DURING THIS VISIT:  Discharge Medication List as of 10/03/2015 12:26 PM       Note:  This document was prepared using Dragon voice recognition software and may include unintentional dictation errors.    Arnaldo NatalPaul F Malinda, MD 10/03/15 [phone]

## 2016-06-16 ENCOUNTER — Emergency Department
Admission: EM | Admit: 2016-06-16 | Discharge: 2016-06-16 | Disposition: A | Discharge status: Home / Self Care | Payer: Medicare Other | Attending: Emergency Medicine | Admitting: Emergency Medicine

## 2016-06-16 ENCOUNTER — Encounter: Payer: Self-pay | Admitting: Medical Oncology

## 2016-06-16 ENCOUNTER — Emergency Department: Payer: Medicare Other

## 2016-06-16 DIAGNOSIS — R1033 Periumbilical pain: Secondary | ICD-10-CM | POA: Diagnosis present

## 2016-06-16 DIAGNOSIS — R1909 Other intra-abdominal and pelvic swelling, mass and lump: Secondary | ICD-10-CM | POA: Insufficient documentation

## 2016-06-16 DIAGNOSIS — Z79899 Other long term (current) drug therapy: Secondary | ICD-10-CM | POA: Diagnosis not present

## 2016-06-16 DIAGNOSIS — R188 Other ascites: Secondary | ICD-10-CM

## 2016-06-16 DIAGNOSIS — I10 Essential (primary) hypertension: Secondary | ICD-10-CM | POA: Diagnosis not present

## 2016-06-16 DIAGNOSIS — R1084 Generalized abdominal pain: Secondary | ICD-10-CM

## 2016-06-16 LAB — CBC
HCT: 44.6 % (ref 40.0–52.0)
HEMOGLOBIN: 15.2 g/dL (ref 13.0–18.0)
MCH: 30.8 pg (ref 26.0–34.0)
MCHC: 34.2 g/dL (ref 32.0–36.0)
MCV: 90.1 fL (ref 80.0–100.0)
Platelets: 270 10*3/uL (ref 150–440)
RBC: 4.95 MIL/uL (ref 4.40–5.90)
RDW: 13 % (ref 11.5–14.5)
WBC: 6.3 10*3/uL (ref 3.8–10.6)

## 2016-06-16 LAB — COMPREHENSIVE METABOLIC PANEL
ALBUMIN: 4.6 g/dL (ref 3.5–5.0)
ALT: 15 U/L — ABNORMAL LOW (ref 17–63)
AST: 26 U/L (ref 15–41)
Alkaline Phosphatase: 59 U/L (ref 38–126)
Anion gap: 6 (ref 5–15)
BILIRUBIN TOTAL: 1.5 mg/dL — AB (ref 0.3–1.2)
BUN: 10 mg/dL (ref 6–20)
CO2: 30 mmol/L (ref 22–32)
Calcium: 9.1 mg/dL (ref 8.9–10.3)
Chloride: 102 mmol/L (ref 101–111)
Creatinine, Ser: 0.91 mg/dL (ref 0.61–1.24)
GFR calc Af Amer: >60 mL/min (ref >60)
GFR calc non Af Amer: >60 mL/min (ref >60)
GLUCOSE: 114 mg/dL — AB (ref 65–99)
Potassium: 4.1 mmol/L (ref 3.5–5.1)
SODIUM: 138 mmol/L (ref 135–145)
TOTAL PROTEIN: 7.8 g/dL (ref 6.5–8.1)

## 2016-06-16 LAB — URINALYSIS, COMPLETE (UACMP) WITH MICROSCOPIC
BACTERIA UA: NONE SEEN
BILIRUBIN URINE: NEGATIVE
Glucose, UA: NEGATIVE mg/dL
Hgb urine dipstick: NEGATIVE
KETONES UR: NEGATIVE mg/dL
LEUKOCYTES UA: NEGATIVE
NITRITE: NEGATIVE
PROTEIN: NEGATIVE mg/dL
RBC / HPF: NONE SEEN RBC/hpf (ref 0–5)
Specific Gravity, Urine: 1.013 (ref 1.005–1.030)
Squamous Epithelial / LPF: NONE SEEN
WBC UA: NONE SEEN WBC/hpf (ref 0–5)
pH: 6 (ref 5.0–8.0)

## 2016-06-16 LAB — LIPASE, BLOOD: Lipase: 28 U/L (ref 11–51)

## 2016-06-16 MED ORDER — IOPAMIDOL (ISOVUE-300) INJECTION 61%
100.0000 mL | Freq: Once | INTRAVENOUS | Status: AC | PRN
  Administered 2016-06-16: 100 mL via INTRAVENOUS

## 2016-06-16 MED ORDER — SULFAMETHOXAZOLE-TRIMETHOPRIM 800-160 MG PO TABS: 1.0000 | ORAL_TABLET | Freq: Two times a day (BID) | ORAL | 0 refills | Status: DC

## 2016-06-16 MED ORDER — IOPAMIDOL (ISOVUE-300) INJECTION 61%
30.0000 mL | Freq: Once | INTRAVENOUS | Status: AC | PRN
  Administered 2016-06-16: 30 mL via ORAL

## 2016-06-16 NOTE — ED Provider Notes (Signed)
[facility] Emergency Department Provider Note   ____________________________________________   I have reviewed the triage vital signs and the nursing notes.   HISTORY  Chief Complaint Abdominal Pain   History limited by: Not Limited   HPI Stanley GandyWillie Byas Jr. is a 76 y.o. male who presents to the emergency department today because of abdominal pain. Patient states it is located periumbilical area as well as on the left side. Has been present for the past 3 days. It has been constant. The patient has not had any associated nausea or vomiting. He did have some diarrhea when the pain started and took some antidiarrheal medicine and states he has not had a bowel movement since. Patient has a not noticed any fevers. He states he has not noticed any change of pain with eating.   Past Medical History:  Diagnosis Date  . Hypertension     There are no active problems to display for this patient.   Past Surgical History:  Procedure Laterality Date  . HERNIA REPAIR      Prior to Admission medications   Medication Sig Start Date End Date Taking? Authorizing Provider  clonazePAM (KLONOPIN) 2 MG tablet Take 1 tablet (2 mg total) by mouth 2 (two) times daily as needed for anxiety. 12/19/14   Emily FilbertJonathan E Williams, MD  lisinopril (PRINIVIL,ZESTRIL) 20 MG tablet Take 10 mg by mouth daily.    Historical Provider, MD  pregabalin (LYRICA) 150 MG capsule Take 150 mg by mouth 2 (two) times daily.    Historical Provider, MD    Allergies Patient has no known allergies.  No family history on file.  Social History Social History  Substance Use Topics  . Smoking status: Never Smoker  . Smokeless tobacco: Not on file  . Alcohol use No    Review of Systems  Constitutional: Negative for fever. Cardiovascular: Negative for chest pain. Respiratory: Negative for shortness of breath. Gastrointestinal: Positive for abdominal pain. Neurological: Negative for headaches,  focal weakness or numbness.  10-point ROS otherwise negative.  ____________________________________________   PHYSICAL EXAM:  VITAL SIGNS: ED Triage Vitals  Enc Vitals Group     BP 06/16/16 0956 (!) 144/69     Pulse Rate 06/16/16 0956 84     Resp 06/16/16 0956 18     Temp 06/16/16 0956 98.9 F (37.2 C)     Temp Source 06/16/16 0956 Oral     SpO2 06/16/16 0956 96 %     Weight 06/16/16 0956 200 lb (90.7 kg)     Height 06/16/16 0956 5\' 4"  (1.626 m)     Head Circumference --      Peak Flow --      Pain Score 06/16/16 0957 9   Constitutional: Alert and oriented. Well appearing and in no distress. Eyes: Conjunctivae are normal. Normal extraocular movements. ENT   Head: Normocephalic and atraumatic.   Nose: No congestion/rhinnorhea.   Mouth/Throat: Mucous membranes are moist.   Neck: No stridor. Hematological/Lymphatic/Immunilogical: No cervical lymphadenopathy. Cardiovascular: Normal rate, regular rhythm.  No murmurs, rubs, or gallops.  Respiratory: Normal respiratory effort without tachypnea nor retractions. Breath sounds are clear and equal bilaterally. No wheezes/rales/rhonchi. Gastrointestinal: Soft and minimally tender to palpation in the periumbilical region. Some tympany. Genitourinary: Deferred Musculoskeletal: Normal range of motion in all extremities. No lower extremity edema. Neurologic:  Normal speech and language. No gross focal neurologic deficits are appreciated.  Skin:  Skin is warm, dry and intact. No rash noted. Psychiatric: Mood and affect are  normal. Speech and behavior are normal. Patient exhibits appropriate insight and judgment.  ____________________________________________    LABS (pertinent positives/negatives)  Labs Reviewed  COMPREHENSIVE METABOLIC PANEL - Abnormal; Notable for the following:       Result Value   Glucose, Bld 114 (*)    ALT 15 (*)    Total Bilirubin 1.5 (*)    All other components within normal limits  URINALYSIS,  COMPLETE (UACMP) WITH MICROSCOPIC - Abnormal; Notable for the following:    Color, Urine COLORLESS (*)    APPearance CLEAR (*)    All other components within normal limits  LIPASE, BLOOD  CBC     ____________________________________________   EKG  None  ____________________________________________    RADIOLOGY  CT abd/pel IMPRESSION:  Large segments of bowel and portions of the stomach under distended  limiting evaluation. Taking this limitation into account, no  extraluminal inflammatory process or free air is noted. Postsurgical  changes right colon.    Appendix and gallbladder not visualized.    Prior anterior wall incision. Below the incision site, there is a  8.8 x 2.2 x 9.2 cm fluid collection new from 2009 and of  indeterminate etiology. It is possible this represents postoperative  sterile fluid collection although abscess cannot be excluded in the  proper clinical setting.    Aortic atherosclerosis.    Fatty liver.    ____________________________________________   PROCEDURES  Procedures  ____________________________________________   INITIAL IMPRESSION / ASSESSMENT AND PLAN / ED COURSE  Pertinent labs & imaging results that were available during my care of the patient were reviewed by me and considered in my medical decision making (see chart for details).  Patient presented to the emergency department today with 3 day history of abdominal pain. History of hernia surgery and repair a number of years ago. On exam patient had some mild periumbilical tenderness over no rebound or guarding. No leukocytosis or fever. CT scan was obtained given history of previous surgeries and mild tenderness. It does show a fluid collection at the site of the hernia repair and mesh. Did discuss with Dr. 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 However given that the patient is now having pain he did recommend putting the  patient on a course of Bactrim. He will follow-up with patient in clinic to evaluate effectiveness of treatment.  ____________________________________________   FINAL CLINICAL IMPRESSION(S) / ED DIAGNOSES  Final diagnoses:  Generalized abdominal pain  Intraabdominal fluid collection     Note: This dictation was prepared with Dragon dictation. Any transcriptional errors that result from this process are unintentional     Phineas Semen, MD 06/16/16 1434

## 2016-06-16 NOTE — ED Notes (Signed)
Patient transported to CT 

## 2016-06-16 NOTE — ED Triage Notes (Signed)
Pt ambulatory to triage with bojangles bag and drink in hand with reports of lower abd pain that began 3 days ago. Pt denies NV, reports one loose stool.

## 2016-06-16 NOTE — Discharge Instructions (Signed)
Please seek medical attention for any high fevers, chest pain, shortness of breath, change in behavior, persistent vomiting, bloody stool or any other new or concerning symptoms.  

## 2016-06-19 ENCOUNTER — Emergency Department: Payer: Medicare Other

## 2016-06-19 ENCOUNTER — Encounter: Payer: Self-pay | Admitting: *Deleted

## 2016-06-19 ENCOUNTER — Emergency Department
Admission: EM | Admit: 2016-06-19 | Discharge: 2016-06-19 | Disposition: A | Discharge status: Home / Self Care | Payer: Medicare Other | Attending: Emergency Medicine | Admitting: Emergency Medicine

## 2016-06-19 DIAGNOSIS — R1033 Periumbilical pain: Secondary | ICD-10-CM | POA: Diagnosis present

## 2016-06-19 DIAGNOSIS — Z79899 Other long term (current) drug therapy: Secondary | ICD-10-CM | POA: Insufficient documentation

## 2016-06-19 DIAGNOSIS — I10 Essential (primary) hypertension: Secondary | ICD-10-CM | POA: Diagnosis not present

## 2016-06-19 DIAGNOSIS — S301XXA Contusion of abdominal wall, initial encounter: Secondary | ICD-10-CM

## 2016-06-19 DIAGNOSIS — L7634 Postprocedural seroma of skin and subcutaneous tissue following other procedure: Secondary | ICD-10-CM | POA: Insufficient documentation

## 2016-06-19 LAB — URINALYSIS, COMPLETE (UACMP) WITH MICROSCOPIC
Bacteria, UA: NONE SEEN
Bilirubin Urine: NEGATIVE
Glucose, UA: NEGATIVE mg/dL
HGB URINE DIPSTICK: NEGATIVE
KETONES UR: NEGATIVE mg/dL
Leukocytes, UA: NEGATIVE
NITRITE: NEGATIVE
PH: 5 (ref 5.0–8.0)
Protein, ur: 30 mg/dL — AB
Specific Gravity, Urine: 1.014 (ref 1.005–1.030)
Squamous Epithelial / LPF: NONE SEEN

## 2016-06-19 LAB — COMPREHENSIVE METABOLIC PANEL
ALBUMIN: 4.6 g/dL (ref 3.5–5.0)
ALK PHOS: 67 U/L (ref 38–126)
ALT: 13 U/L — ABNORMAL LOW (ref 17–63)
AST: 22 U/L (ref 15–41)
Anion gap: 8 (ref 5–15)
BILIRUBIN TOTAL: 1 mg/dL (ref 0.3–1.2)
BUN: 15 mg/dL (ref 6–20)
CALCIUM: 9.1 mg/dL (ref 8.9–10.3)
CO2: 26 mmol/L (ref 22–32)
Chloride: 102 mmol/L (ref 101–111)
Creatinine, Ser: 1.31 mg/dL — ABNORMAL HIGH (ref 0.61–1.24)
GFR calc Af Amer: 60 mL/min — ABNORMAL LOW (ref >60)
GFR, EST NON AFRICAN AMERICAN: 52 mL/min — AB (ref >60)
GLUCOSE: 131 mg/dL — AB (ref 65–99)
Potassium: 4.2 mmol/L (ref 3.5–5.1)
Sodium: 136 mmol/L (ref 135–145)
TOTAL PROTEIN: 7.8 g/dL (ref 6.5–8.1)

## 2016-06-19 LAB — CBC
HCT: 42.9 % (ref 40.0–52.0)
Hemoglobin: 14.8 g/dL (ref 13.0–18.0)
MCH: 31.3 pg (ref 26.0–34.0)
MCHC: 34.6 g/dL (ref 32.0–36.0)
MCV: 90.5 fL (ref 80.0–100.0)
Platelets: 273 10*3/uL (ref 150–440)
RBC: 4.73 MIL/uL (ref 4.40–5.90)
RDW: 13.1 % (ref 11.5–14.5)
WBC: 7.6 10*3/uL (ref 3.8–10.6)

## 2016-06-19 LAB — LIPASE, BLOOD: Lipase: 33 U/L (ref 11–51)

## 2016-06-19 MED ORDER — OXYCODONE HCL 5 MG PO TABS: 5.0000 mg | ORAL_TABLET | Freq: Three times a day (TID) | ORAL | 0 refills | Status: DC | PRN

## 2016-06-19 MED ORDER — CEFTRIAXONE SODIUM-DEXTROSE 1-3.74 GM-% IV SOLR
1.0000 g | Freq: Once | INTRAVENOUS | Status: AC
  Administered 2016-06-19: 1 g via INTRAVENOUS
  Filled 2016-06-19: qty 50

## 2016-06-19 MED ORDER — IOPAMIDOL (ISOVUE-300) INJECTION 61%
30.0000 mL | Freq: Once | INTRAVENOUS | Status: AC
  Administered 2016-06-19: 30 mL via ORAL

## 2016-06-19 MED ORDER — ONDANSETRON HCL 4 MG/2ML IJ SOLN
4.0000 mg | Freq: Once | INTRAMUSCULAR | Status: AC
  Administered 2016-06-19: 4 mg via INTRAVENOUS
  Filled 2016-06-19: qty 2

## 2016-06-19 MED ORDER — SULFAMETHOXAZOLE-TRIMETHOPRIM 800-160 MG PO TABS: 1.0000 | ORAL_TABLET | Freq: Two times a day (BID) | ORAL | 0 refills | Status: DC

## 2016-06-19 MED ORDER — MORPHINE SULFATE (PF) 4 MG/ML IV SOLN
4.0000 mg | Freq: Once | INTRAVENOUS | Status: AC
  Administered 2016-06-19: 4 mg via INTRAVENOUS
  Filled 2016-06-19: qty 1

## 2016-06-19 MED ORDER — ONDANSETRON HCL 4 MG PO TABS: 4.0000 mg | ORAL_TABLET | Freq: Three times a day (TID) | ORAL | 0 refills | Status: DC | PRN

## 2016-06-19 MED ORDER — IOPAMIDOL (ISOVUE-300) INJECTION 61%
100.0000 mL | Freq: Once | INTRAVENOUS | Status: AC | PRN
  Administered 2016-06-19: 100 mL via INTRAVENOUS

## 2016-06-19 MED ORDER — DEXTROSE 5 % IV SOLN: 1.0000 g | Freq: Once | INTRAVENOUS | Status: DC

## 2016-06-19 NOTE — ED Provider Notes (Signed)
[facility] Emergency Department Provider Note  ____________________________________________  Time seen: Approximately 5:16 PM  I have reviewed the triage vital signs and the nursing notes.   HISTORY  Chief Complaint Abdominal Pain   HPI Stanley GandyWillie Costanza Jr. is a 76 y.o. male history of hypertension and 2 abdominal hernia repairs with mesh done 10 years ago by Dr. Excell Seltzerooper who presents for evaluation of abdominal pain. Patient was seen here 3 days ago with a CT that was concerning for a fluid collectionlocated in the incision site. Patient was evaluated by Dr. Excell Seltzerooper who thought this was a chronic seroma however since patient was having new pain and he was put on Bactrim. He reports that his pain is unchanged and he continues to have low-grade fever. He reports that the pain started a week ago, it is dull and constant, located periumbilical, nonradiating. He has had nausea and low-grade fever for the last few days. No diarrhea, no dysuria, no chest pain, no shortness of breath, no URI symptoms. Patient reports that he has never had similar pain in the past.  Past Medical History:  Diagnosis Date  . Hypertension     There are no active problems to display for this patient.   Past Surgical History:  Procedure Laterality Date  . HERNIA REPAIR      Prior to Admission medications   Medication Sig Start Date End Date Taking? Authorizing Provider  acetaminophen (TYLENOL) 500 MG tablet Take 1,000 mg by mouth every 6 (six) hours as needed.    Historical Provider, MD  Chlorpheniramine-Acetaminophen (CORICIDIN HBP COLD/FLU PO) Take 1 tablet by mouth daily.    Historical Provider, MD  clonazePAM (KLONOPIN) 2 MG tablet Take 1 tablet (2 mg total) by mouth 2 (two) times daily as needed for anxiety. 12/19/14   Emily FilbertJonathan E Williams, MD  lisinopril (PRINIVIL,ZESTRIL) 20 MG tablet Take 10 mg by mouth daily.    Historical Provider, MD  ondansetron (ZOFRAN) 4 MG tablet Take 1  tablet (4 mg total) by mouth every 8 (eight) hours as needed for nausea or vomiting. 06/19/16   Nita Sicklearolina Rayanne Padmanabhan, MD  oxyCODONE (ROXICODONE) 5 MG immediate release tablet Take 1 tablet (5 mg total) by mouth every 8 (eight) hours as needed. 06/19/16 06/19/17  Nita Sicklearolina Jackalyn Haith, MD  pregabalin (LYRICA) 150 MG capsule Take 150 mg by mouth 2 (two) times daily.    Historical Provider, MD  sulfamethoxazole-trimethoprim (BACTRIM DS,SEPTRA DS) 800-160 MG tablet Take 1 tablet by mouth 2 (two) times daily. 06/16/16   Phineas SemenGraydon Goodman, MD  sulfamethoxazole-trimethoprim (BACTRIM DS,SEPTRA DS) 800-160 MG tablet Take 1 tablet by mouth 2 (two) times daily. 06/19/16   Nita Sicklearolina Aurianna Earlywine, MD    Allergies Patient has no known allergies.  History reviewed. No pertinent family history.  Social History Social History  Substance Use Topics  . Smoking status: Never Smoker  . Smokeless tobacco: Not on file  . Alcohol use No    Review of Systems  Constitutional: Negative for fever. Eyes: Negative for visual changes. ENT: Negative for sore throat. Neck: No neck pain  Cardiovascular: Negative for chest pain. Respiratory: Negative for shortness of breath. Gastrointestinal: + periumbilical abdominal pain and nausea. No vomiting or diarrhea. Genitourinary: Negative for dysuria. Musculoskeletal: Negative for back pain. Skin: Negative for rash. Neurological: Negative for headaches, weakness or numbness. Psych: No SI or HI  ____________________________________________   PHYSICAL EXAM:  VITAL SIGNS: ED Triage Vitals [06/19/16 1440]  Enc Vitals Group     BP (!) 115/58  Pulse Rate 92     Resp 18     Temp 99.2 F (37.3 C)     Temp Source Oral     SpO2 95 %     Weight 200 lb (90.7 kg)     Height 5\' 4"  (1.626 m)     Head Circumference      Peak Flow      Pain Score 9     Pain Loc      Pain Edu?      Excl. in GC?     Constitutional: Alert and oriented. Well appearing and in no apparent  distress. HEENT:      Head: Normocephalic and atraumatic.         Eyes: Conjunctivae are normal. Sclera is non-icteric. EOMI. PERRL      Mouth/Throat: Mucous membranes are moist.       Neck: Supple with no signs of meningismus. Cardiovascular: Regular rate and rhythm. No murmurs, gallops, or rubs. 2+ symmetrical distal pulses are present in all extremities. No JVD. Respiratory: Normal respiratory effort. Lungs are clear to auscultation bilaterally. No wheezes, crackles, or rhonchi.  Gastrointestinal: Soft, well-healed abdominal scars with significant tenderness to palpation on the scar over the periumbilical region, no rebound or guarding Musculoskeletal: Nontender with normal range of motion in all extremities. No edema, cyanosis, or erythema of extremities. Neurologic: Normal speech and language. Face is symmetric. Moving all extremities. No gross focal neurologic deficits are appreciated. Skin: Skin is warm, dry and intact. No rash noted. Psychiatric: Mood and affect are normal. Speech and behavior are normal.  ____________________________________________   LABS (all labs ordered are listed, but only abnormal results are displayed)  Labs Reviewed  COMPREHENSIVE METABOLIC PANEL - Abnormal; Notable for the following:       Result Value   Glucose, Bld 131 (*)    Creatinine, Ser 1.31 (*)    ALT 13 (*)    GFR calc non Af Amer 52 (*)    GFR calc Af Amer 60 (*)    All other components within normal limits  URINALYSIS, COMPLETE (UACMP) WITH MICROSCOPIC - Abnormal; Notable for the following:    Color, Urine YELLOW (*)    APPearance CLEAR (*)    Protein, ur 30 (*)    All other components within normal limits  LIPASE, BLOOD  CBC   ____________________________________________  EKG  none ____________________________________________  RADIOLOGY  CT a/p: No significant change in 2 x 8 cm rim enhancing fluid collection in anterior abdomen just deep to rectus abdominus muscles.  Differential diagnosis includes postop seroma, lymphocele, and abscess.  Colonic diverticulosis. No radiographic evidence of diverticulitis.  Aortic atherosclerosis. ____________________________________________   PROCEDURES  Procedure(s) performed: None Procedures Critical Care performed:  None ____________________________________________   INITIAL IMPRESSION / ASSESSMENT AND PLAN / ED COURSE  76 y.o. male history of hypertension and 2 abdominal hernia repairs with mesh done 10 years ago by Dr. Excell Seltzer who presents for evaluation of abdominal pain. Seen here 3 days ago with a fluid collection below the incision for a prior abdominal hernia repair. At that time this was thought to be a chronic seroma however since patient was having fever and pain he was started on Bactrim. Has been taking Bactrim for 3 days and continues to have nausea, low-grade fever, and unchanged pain. He has significant tenderness at that same location over the incision on the periumbilical region. Here he has a temp of 99.2 and the remainder of his vital signs are within  normal limits. Repeat imaging to see if there is any changes to this fluid collection concerning for an abscess. We'll repeat blood work. We'll give IV fluids, IV morphine and Zofran for symptom relief.  Clinical Course as of Jun 20 1927  Fri Jun 19, 2016  1919 CT showing unchanged fluid collection with a normal white count. I discussed with Dr. Excell Seltzer who recommended giving patient a dose of Rocephin and sending him home on 2 weeks of Bactrim. He told me that it may take up to 2 weeks for patient to feel better. He is trying to avoid surgery unless patient's presentation is really concerning for an infection as this will be a very large surgery to be able to remove the mass. Patient is comfortable with this plan and has an appointment already scheduled with Dr. Excell Seltzer for 2 weeks. We'll give him a prescription for pain medication and nausea medication.   [CV]    Clinical Course User Index [CV] Nita Sickle, MD    Pertinent labs & imaging results that were available during my care of the patient were reviewed by me and considered in my medical decision making (see chart for details).    ____________________________________________   FINAL CLINICAL IMPRESSION(S) / ED DIAGNOSES  Final diagnoses:  Periumbilical abdominal pain  Abdominal wall seroma, initial encounter (HCC)      NEW MEDICATIONS STARTED DURING THIS VISIT:  New Prescriptions   ONDANSETRON (ZOFRAN) 4 MG TABLET    Take 1 tablet (4 mg total) by mouth every 8 (eight) hours as needed for nausea or vomiting.   OXYCODONE (ROXICODONE) 5 MG IMMEDIATE RELEASE TABLET    Take 1 tablet (5 mg total) by mouth every 8 (eight) hours as needed.   SULFAMETHOXAZOLE-TRIMETHOPRIM (BACTRIM DS,SEPTRA DS) 800-160 MG TABLET    Take 1 tablet by mouth 2 (two) times daily.     Note:  This document was prepared using Dragon voice recognition software and may include unintentional dictation errors.    Nita Sickle, MD 06/19/16 [phone]

## 2016-06-19 NOTE — ED Notes (Signed)
Patient started desating to 87% room air after morphine administration.  Placed on 2L nasal cannula and returned saturation to 95% with no discomfort noted.  MD notified and patient in NAD at this time.

## 2016-06-19 NOTE — ED Notes (Signed)
Pt. Going home with daughter. 

## 2016-06-19 NOTE — Discharge Instructions (Signed)
Take bactrim twice daily for a total of 2 weeks.  Follow up with Dr. Excell Seltzerooper in 2 weeks. Return to the ER if you have worsening abdominal pain or fever greater than 101F.  Pain control: Take tylenol 1000mg  every 8 hours. Take 5mg  of oxycodone every 6 hours for breakthrough pain. If you need the oxycodone make sure to take one senokot as well to prevent constipation.  Do not drink alcohol, drive or participate in any other potentially dangerous activities while taking this medication as it may make you sleepy. Do not take this medication with any other sedating medications, either prescription or over-the-counter.

## 2016-06-19 NOTE — ED Notes (Signed)
CT called and informed that patient has completed his contrast.  

## 2016-06-19 NOTE — ED Triage Notes (Signed)
Pt arrive with continued abd pain, he was seen in ED 3 days ago for same complaint and given abx, awake and alert in no acute distress

## 2016-06-19 NOTE — ED Notes (Signed)
Patient transported to CT 

## 2016-06-20 ENCOUNTER — Encounter: Payer: Self-pay | Admitting: *Deleted

## 2016-06-20 ENCOUNTER — Emergency Department: Payer: Medicare Other

## 2016-06-20 ENCOUNTER — Emergency Department
Admission: EM | Admit: 2016-06-20 | Discharge: 2016-06-20 | Payer: Medicare Other | Attending: Emergency Medicine | Admitting: Emergency Medicine

## 2016-06-20 DIAGNOSIS — R0602 Shortness of breath: Secondary | ICD-10-CM | POA: Diagnosis not present

## 2016-06-20 DIAGNOSIS — Z79899 Other long term (current) drug therapy: Secondary | ICD-10-CM | POA: Diagnosis not present

## 2016-06-20 DIAGNOSIS — I10 Essential (primary) hypertension: Secondary | ICD-10-CM | POA: Insufficient documentation

## 2016-06-20 DIAGNOSIS — R519 Headache, unspecified: Secondary | ICD-10-CM

## 2016-06-20 DIAGNOSIS — R51 Headache: Secondary | ICD-10-CM | POA: Diagnosis present

## 2016-06-20 DIAGNOSIS — R0902 Hypoxemia: Secondary | ICD-10-CM | POA: Insufficient documentation

## 2016-06-20 HISTORY — DX: Infection following a procedure, other surgical site, initial encounter: T81.49XA

## 2016-06-20 LAB — BASIC METABOLIC PANEL
Anion gap: 5 (ref 5–15)
BUN: 17 mg/dL (ref 6–20)
CALCIUM: 8.8 mg/dL — AB (ref 8.9–10.3)
CO2: 30 mmol/L (ref 22–32)
CREATININE: 1.32 mg/dL — AB (ref 0.61–1.24)
Chloride: 98 mmol/L — ABNORMAL LOW (ref 101–111)
GFR calc non Af Amer: 51 mL/min — ABNORMAL LOW (ref >60)
GFR, EST AFRICAN AMERICAN: 59 mL/min — AB (ref >60)
Glucose, Bld: 108 mg/dL — ABNORMAL HIGH (ref 65–99)
Potassium: 4.1 mmol/L (ref 3.5–5.1)
SODIUM: 133 mmol/L — AB (ref 135–145)

## 2016-06-20 LAB — HEPATIC FUNCTION PANEL
ALT: 27 U/L (ref 17–63)
AST: 38 U/L (ref 15–41)
Albumin: 4.2 g/dL (ref 3.5–5.0)
Alkaline Phosphatase: 69 U/L (ref 38–126)
Bilirubin, Direct: <0.1 mg/dL — ABNORMAL LOW (ref 0.1–0.5)
TOTAL PROTEIN: 7.4 g/dL (ref 6.5–8.1)
Total Bilirubin: 1.2 mg/dL (ref 0.3–1.2)

## 2016-06-20 LAB — CBC WITH DIFFERENTIAL/PLATELET
BASOS PCT: 1 %
Basophils Absolute: 0.1 10*3/uL (ref 0–0.1)
EOS ABS: 0.1 10*3/uL (ref 0–0.7)
Eosinophils Relative: 1 %
HCT: 39.8 % — ABNORMAL LOW (ref 40.0–52.0)
Hemoglobin: 13.7 g/dL (ref 13.0–18.0)
Lymphocytes Relative: 18 %
Lymphs Abs: 1.5 10*3/uL (ref 1.0–3.6)
MCH: 31.2 pg (ref 26.0–34.0)
MCHC: 34.5 g/dL (ref 32.0–36.0)
MCV: 90.4 fL (ref 80.0–100.0)
MONO ABS: 1.1 10*3/uL — AB (ref 0.2–1.0)
MONOS PCT: 13 %
NEUTROS PCT: 67 %
Neutro Abs: 5.4 10*3/uL (ref 1.4–6.5)
PLATELETS: 267 10*3/uL (ref 150–440)
RBC: 4.4 MIL/uL (ref 4.40–5.90)
RDW: 13.2 % (ref 11.5–14.5)
WBC: 8.1 10*3/uL (ref 3.8–10.6)

## 2016-06-20 LAB — INFLUENZA PANEL BY PCR (TYPE A & B)
INFLAPCR: NEGATIVE
INFLBPCR: NEGATIVE

## 2016-06-20 LAB — BRAIN NATRIURETIC PEPTIDE: B NATRIURETIC PEPTIDE 5: 21 pg/mL (ref 0.0–100.0)

## 2016-06-20 LAB — TROPONIN I: Troponin I: <0.03 ng/mL (ref <0.03)

## 2016-06-20 LAB — LACTIC ACID, PLASMA: LACTIC ACID, VENOUS: 1 mmol/L (ref 0.5–1.9)

## 2016-06-20 MED ORDER — ENOXAPARIN SODIUM 100 MG/ML ~~LOC~~ SOLN
1.0000 mg/kg | Freq: Once | SUBCUTANEOUS | Status: AC
  Administered 2016-06-20: 90 mg via SUBCUTANEOUS
  Filled 2016-06-20: qty 1

## 2016-06-20 NOTE — ED Notes (Signed)
Patient transported to CT 

## 2016-06-20 NOTE — ED Notes (Addendum)
Talked to DTR, patient states his cell phone was missing, but has his Consulting civil engineercharger. DTR states the cell phone was left in the car and will bring it to him later. Daughter is Elnita MaxwellCheryl [phone].

## 2016-06-20 NOTE — ED Notes (Signed)
Pt states headache is improved after oxygen administration. Pt was 85% on ra prior to application of oxygen. Pt with pox of 96% after application of 3lpm via [place].

## 2016-06-20 NOTE — ED Provider Notes (Addendum)
[facility]  I accepted care from Dr. Alphonzo LemmingsMcShane. ____________________________________________    LABS (pertinent positives/negatives)  Labs Reviewed  CBC WITH DIFFERENTIAL/PLATELET - Abnormal; Notable for the following:       Result Value   HCT 39.8 (*)    Monocytes Absolute 1.1 (*)    All other components within normal limits  HEPATIC FUNCTION PANEL - Abnormal; Notable for the following:    Bilirubin, Direct <0.1 (*)    All other components within normal limits  BASIC METABOLIC PANEL - Abnormal; Notable for the following:    Sodium 133 (*)    Chloride 98 (*)    Glucose, Bld 108 (*)    Creatinine, Ser 1.32 (*)    Calcium 8.8 (*)    GFR calc non Af Amer 51 (*)    GFR calc Af Amer 59 (*)    All other components within normal limits  CULTURE, BLOOD (ROUTINE X 2)  CULTURE, BLOOD (ROUTINE X 2)  LACTIC ACID, PLASMA  BRAIN NATRIURETIC PEPTIDE  TROPONIN I  INFLUENZA PANEL BY PCR (TYPE A & B)     ____________________________________________    RADIOLOGY All xrays were viewed by me. Imaging interpreted by radiologist.  CT head without contrast:  IMPRESSION: 1. No acute intracranial abnormality. 2. Atrophy and minimal chronic microvascular white matter ischemic changes.  ____________________________________________   PROCEDURES  Procedure(s) performed: None  Critical Care performed: None  ____________________________________________   INITIAL IMPRESSION / ASSESSMENT AND PLAN / ED COURSE   Pertinent labs & imaging results that were available during my care of the patient were reviewed by me and considered in my medical decision making (see chart for details).  [name] was signed out to me awaiting laboratory studies and chest x-ray for headache and hypoxia this morning.  When I went in to check on him he was wearing 3 L nasal cannula satting 94% on those 3 L. He does not have a history of underlying lung disease, and does not use oxygen.  He does not use inhalers.  He is taken off the oxygen and did over the course of about 5-7 minutes drop his O2 sat down to 88% on room air. Patient was placed back on 3 L nasal cannula.  His laboratory studies are reassuring, with no evidence supporting obvious infection, CHF, acute cardiac issue.  In speaking with the patient little further, it sounds like he was having some trouble swallowing last night, and there could've been an episode of possible aspiration although he is not coughing now. He woke up with a headache which is now much improved, but given this swallowing issue plus the headache, I did discuss obtaining head CT. There is no focal neurologic deficit in terms of weakness or numbness or slurred speech or facial droop per history or on exam today.  Given the hypoxia, I did discuss with the patient obtaining CT chest angiogram to rule out PE. However, after discussing with the radiology tech, the patient received a CT scan with contrast yesterday of his abdomen in the emergency department, and per the radiologist is recommended that he wait at least 24 hours before her next CT dye load.  I will admit to the hospitalist, he may have further workup and/or possible chest CT with contrast if you hours, totally reduce risk of utilizing the dye for evaluation of possible PE.   In any case, he is going need to be set up for home oxygen with hypoxia.   CONSULTATIONS: Hospitalist for  admission.   Patient / Family / Caregiver informed of clinical course, medical decision-making process, and agree with plan.   Addended, because patient is under the care of the [place], I submitted paperwork for transfer to the [place]. I also reviewed the time of the contrast load yesterday was around 5 PM, and so to wait 24 hours for PE scanning, I am going to go ahead and cover him with a dose of Lovenox.  I spoke with Dr. Alfonzo Beers, [facility] ER, accepted to ER. ____________________________________________   FINAL  CLINICAL IMPRESSION(S) / ED DIAGNOSES  Final diagnoses:  Hypoxia  Acute nonintractable headache, unspecified headache type        Governor Rooks, MD 06/20/16 1610    Governor Rooks, MD 06/20/16 1144

## 2016-06-20 NOTE — ED Triage Notes (Signed)
Pt states woke w/ headache at 0200, took tylenol, was unable to return to sleep. Pt has slight fever. Pt has cough. Pt is somewhat of a poor historian. Pt's family states he was short of breath while eating last night.

## 2016-06-20 NOTE — ED Notes (Signed)
Report to ashley, rn

## 2016-06-20 NOTE — ED Notes (Signed)
Patient left via [facility]. 

## 2016-06-20 NOTE — ED Provider Notes (Addendum)
[facility] Emergency Department Provider Note  ____________________________________________   I have reviewed the triage vital signs and the nursing notes.   HISTORY  Chief Complaint Headache and Fever    HPI Stanley Baxter. is a 76 y.o. male who is not on home oxygen. He was seen recently in this department last few days for what appears to be a seroma around his mesh. He is not having any abdominal pain. He states he went home from the emergency room yesterday, and because he was very hungry in a chicken pot pie. Bowel was eating the chicken pot pie, he states he choked and gagged and coughed a lot. Has not coughed since then. Woke up in the middle the night though with a low-grade fever and a headache. He does not feel short of breath. Upon arrival however his sats are in the mid 27s on 2 L. He states that he has had no chest pain nausea or vomiting. At this time he has no complaints. Since being placed on oxygen his headache is gone. He does have a low-grade fever here. No high fever at home.     Past Medical History:  Diagnosis Date  . Abscess after procedure   . Hypertension     There are no active problems to display for this patient.   Past Surgical History:  Procedure Laterality Date  . HERNIA REPAIR      Prior to Admission medications   Medication Sig Start Date End Date Taking? Authorizing Provider  acetaminophen (TYLENOL) 500 MG tablet Take 1,000 mg by mouth every 6 (six) hours as needed.   Yes Historical Provider, MD  Chlorpheniramine-Acetaminophen (CORICIDIN HBP COLD/FLU PO) Take 1 tablet by mouth daily.   Yes Historical Provider, MD  clonazePAM (KLONOPIN) 2 MG tablet Take 1 tablet (2 mg total) by mouth 2 (two) times daily as needed for anxiety. 12/19/14  Yes Emily Filbert, MD  lisinopril (PRINIVIL,ZESTRIL) 20 MG tablet Take 10 mg by mouth daily.   Yes Historical Provider, MD  ondansetron (ZOFRAN) 4 MG tablet Take 1 tablet (4 mg  total) by mouth every 8 (eight) hours as needed for nausea or vomiting. 06/19/16  Yes Nita Sickle, MD  oxyCODONE (ROXICODONE) 5 MG immediate release tablet Take 1 tablet (5 mg total) by mouth every 8 (eight) hours as needed. 06/19/16 06/19/17 Yes Nita Sickle, MD  pregabalin (LYRICA) 150 MG capsule Take 150 mg by mouth 2 (two) times daily.   Yes Historical Provider, MD  sulfamethoxazole-trimethoprim (BACTRIM DS,SEPTRA DS) 800-160 MG tablet Take 1 tablet by mouth 2 (two) times daily. 06/16/16  Yes Phineas Semen, MD  sulfamethoxazole-trimethoprim (BACTRIM DS,SEPTRA DS) 800-160 MG tablet Take 1 tablet by mouth 2 (two) times daily. 06/19/16  Yes Nita Sickle, MD    Allergies Patient has no known allergies.  History reviewed. No pertinent family history.  Social History Social History  Substance Use Topics  . Smoking status: Never Smoker  . Smokeless tobacco: Never Used  . Alcohol use No    Review of Systems Constitutional: No fever/chills Eyes: No visual changes. ENT: No sore throat. No stiff neck no neck pain Cardiovascular: Denies chest pain. Respiratory: See history of present illness Gastrointestinal:   no vomiting.  No diarrhea.  No constipation. Genitourinary: Negative for dysuria. Musculoskeletal: Negative lower extremity swelling Skin: Negative for rash. Neurological: Negative for severe headaches, focal weakness or numbness. 10-point ROS otherwise negative.  ____________________________________________   PHYSICAL EXAM:  VITAL SIGNS: ED Triage Vitals  Enc Vitals Group     BP      Pulse      Resp      Temp      Temp src      SpO2      Weight      Height      Head Circumference      Peak Flow      Pain Score      Pain Loc      Pain Edu?      Excl. in GC?     Constitutional: Alert and oriented. Well appearing and in no acute distress. Eyes: Conjunctivae are normal. PERRL. EOMI. Head: Atraumatic. Nose: No congestion/rhinnorhea. Mouth/Throat:  Mucous membranes are moist.  Oropharynx non-erythematous. Neck: No stridor.   Nontender with no meningismus Cardiovascular: Normal rate, regular rhythm. Grossly normal heart sounds.  Good peripheral circulation. Respiratory: Normal respiratory effort.  No retractions. Lungs CTAB. Abdominal: Soft and nontender. No distention. No guarding no rebound Back:  There is no focal tenderness or step off.  there is no midline tenderness there are no lesions noted. there is no CVA tenderness Musculoskeletal: No lower extremity tenderness, no upper extremity tenderness. No joint effusions, no DVT signs strong distal pulses no edema Neurologic:  Normal speech and language. No gross focal neurologic deficits are appreciated.  Skin:  Skin is warm, dry and intact. No rash noted. Psychiatric: Mood and affect are normal. Speech and behavior are normal.  ____________________________________________   LABS (all labs ordered are listed, but only abnormal results are displayed)  Labs Reviewed  CULTURE, BLOOD (ROUTINE X 2)  CULTURE, BLOOD (ROUTINE X 2)  CBC WITH DIFFERENTIAL/PLATELET  LACTIC ACID, PLASMA  LACTIC ACID, PLASMA  URINALYSIS, COMPLETE (UACMP) WITH MICROSCOPIC  HEPATIC FUNCTION PANEL  BRAIN NATRIURETIC PEPTIDE  TROPONIN I   ____________________________________________  EKG  I personally interpreted any EKGs ordered by me or triage Sinus rhythm at 77 bpm no acute ST elevation or acute ST depression normal axis, no acute ischemic changes ____________________________________________  RADIOLOGY  I reviewed any imaging ordered by me or triage that were performed during my shift and, if possible, patient and/or family made aware of any abnormal findings. ____________________________________________   PROCEDURES  Procedure(s) performed: None  Procedures  Critical Care performed: None  ____________________________________________   INITIAL IMPRESSION / ASSESSMENT AND PLAN / ED  COURSE  Pertinent labs & imaging results that were available during my care of the patient were reviewed by me and considered in my medical decision making (see chart for details).  Patient here with headache which is rapidly improving, not worst headache of life, seemed to go away with oxygen, and a new hypoxia. Patient is on 3 L in the mid 90s but had a good waveform on 2 L in the abdomen 80s. This seems to have happened after he went home. His sats were documented as normal prior to that. The patient states she did have a gagging and coughing episode while he was eating after going without food for some time. Most likely therefore I think this is a aspirational event. Less likely is thought to be PE. Pneumonia is possible that again not thought to be very likely given acute onset of these symptoms and the clearly precipitating event that happened while eating. We will obtain chest x-ray, basic blood work etc. I don't think the patient is a primarily cerebral pathology today and I think a CT scan is likely not show anything. This is the O2  sats of his headache went away. Given that a low sat can cause a headache but a headache doesn't generally cause low sats in an alert patient, I think we will forego CT for the moment.  ----------------------------------------- 7:09 AM on 06/20/2016 -----------------------------------------   Signed out to Dr. Shaune Pollack at the end of my shift with everything pending. dispo per her. Likely will need admission.    ____________________________________________   FINAL CLINICAL IMPRESSION(S) / ED DIAGNOSES  Final diagnoses:  None      This chart was dictated using voice recognition software.  Despite best efforts to proofread,  errors can occur which can change meaning.      Jeanmarie Plant, MD 06/20/16 1610    Jeanmarie Plant, MD 06/20/16 [id number]    Jeanmarie Plant, MD 06/20/16 [phone]

## 2016-06-20 NOTE — ED Notes (Signed)
Returned from CT.

## 2016-06-25 LAB — CULTURE, BLOOD (ROUTINE X 2)
CULTURE: NO GROWTH
CULTURE: NO GROWTH

## 2016-07-08 ENCOUNTER — Other Ambulatory Visit: Payer: Self-pay

## 2016-07-08 DIAGNOSIS — R931 Abnormal findings on diagnostic imaging of heart and coronary circulation: Secondary | ICD-10-CM

## 2016-07-08 DIAGNOSIS — F419 Anxiety disorder, unspecified: Secondary | ICD-10-CM

## 2016-07-09 ENCOUNTER — Encounter: Payer: Self-pay | Admitting: Surgery

## 2016-07-09 ENCOUNTER — Inpatient Hospital Stay: Payer: Self-pay | Admitting: Surgery

## 2016-07-09 ENCOUNTER — Ambulatory Visit (INDEPENDENT_AMBULATORY_CARE_PROVIDER_SITE_OTHER): Payer: Medicare Other | Admitting: Surgery

## 2016-07-09 VITALS — BP 183/94 | HR 80 | Temp 97.6°F | Ht 64.0 in | Wt 196.6 lb

## 2016-07-09 DIAGNOSIS — R1013 Epigastric pain: Secondary | ICD-10-CM

## 2016-07-09 NOTE — Patient Instructions (Signed)
Please call our office if you have questions or concerns.   

## 2016-07-09 NOTE — Progress Notes (Signed)
Surgical Consultation  07/09/2016  Stanley Baxter. is an 76 y.o. male.   CC: Abdominal pain  HPI: This a patient who is been to the emergency room with ongoing and recurrent abdominal pain. 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 He has had a ventral hernia repair in the past with mesh and believes that I performed in 2008 and I was not living in [place] nor had hospital privileges at that time. As best as I can tell from reviewing old records that are not completely available through this computer system the patient did see Dr. Pat Patrick in 2012 and was seeing Dr. Lucky Cowboy in 2009.  After interviewing the patient became very clear that they had never met me before. The patient was very confused but ultimately did remember Dr. dew and Dr. Pat Patrick performing surgery. Deceased Dr. dew I believe had provided emergency room services even though he is a vascular surgeon in the past).  Patient describes proximally 3 weeks of discomfort in his periumbilical area and no pain he was having some fevers but had been in the emergency room with a community acquired pneumonia and hypoxemia as well. He was placed on a short course of Bactrim but did not seem to make much difference in his periumbilical pain which by the way is not incapacitating and very minimal in nature.  Past Medical History:  Diagnosis Date  . Abnormal echocardiogram 2016   Dr. Clayborn Bigness  . Abscess after procedure   . Anxiety   . DDD (degenerative disc disease), lumbar 09/20/2013  . Hyperlipidemia, unspecified 09/20/2013  . Hypertension     Past Surgical History:  Procedure Laterality Date  . HERNIA REPAIR  2008   Dr. Burt Knack    Family History  Problem Relation Age of Onset  . Cancer Mother   . Liver disease Father     Social History:  reports that he has never smoked. He has never used smokeless tobacco. He reports that he drinks alcohol. He reports that he does not use drugs.  Allergies: No Known  Allergies  Medications reviewed.   Review of Systems:   Review of Systems  Constitutional: Positive for fever. Negative for chills, malaise/fatigue and weight loss.  HENT: Negative.   Eyes: Negative.   Respiratory: Negative.   Cardiovascular: Negative.   Gastrointestinal: Positive for abdominal pain. Negative for constipation, diarrhea, nausea and vomiting.  Genitourinary: Negative.   Musculoskeletal: Negative.   Skin: Negative.   Neurological: Negative.   Endo/Heme/Allergies: Negative.   Psychiatric/Behavioral: Negative.      Physical Exam:  BP (!) 183/94   Pulse 80   Temp 97.6 F (36.4 C) (Oral)   Ht '5\' 4"'  (1.626 m)   Wt 196 lb 9.6 oz (89.2 kg)   BMI 33.75 kg/m   Physical Exam  Constitutional: No distress.  Weak frail individual who is not very steady on his feet  HENT:  Head: Normocephalic and atraumatic.  Eyes: Pupils are equal, round, and reactive to light. Right eye exhibits no discharge. Left eye exhibits no discharge. No scleral icterus.  Neck: Normal range of motion.  Cardiovascular: Normal rate, regular rhythm and normal heart sounds.   Pulmonary/Chest: Effort normal and breath sounds normal. No respiratory distress. He has no wheezes. He has no rales.  Abdominal: Soft. He exhibits no distension. There is no tenderness. There is no rebound and no guarding.    Soft nontender abdomen with complex scars see diagram. No erythema no drainage nontender no sign of  fistula  Musculoskeletal: Normal range of motion. He exhibits edema.  Lymphadenopathy:    He has no cervical adenopathy.  Neurological: He is alert.  Pierce confused  Skin: Skin is warm and dry. He is not diaphoretic.  Vitals reviewed.     No results found for this or any previous visit (from the past 48 hour(s)). No results found.  Assessment/Plan:  2 different CT scans are personally reviewed showing a small fluid collection around the mesh.  The patient has been confused about the  origin of his surgeries but it is clear that he had never met me before and that I had never perform surgery on him in the past. His surgeries apparently had been likely performed by Dr. dew and Dr. Pat Patrick.  I discussed with he and his daughter the findings of fluid around mesh in a patient who is very minimally symptomatic with fevers at a time when he also had a community acquired pneumonia, with no elevated white blood cell count no tenderness to the abdominal exam and no erythema. The potential for this being infected is not 0 but it is quite low under these circumstances. I discussed with the patient and his daughter the options of surgical intervention versus CT-guided aspiration for culture purposes but at this point I see little evidence that this is infected and would recommend observation only they were both in agreement with this plan and will follow up as needed  Florene Glen, MD, FACS

## 2017-01-12 ENCOUNTER — Emergency Department
Admission: EM | Admit: 2017-01-12 | Discharge: 2017-01-12 | Disposition: A | Discharge status: Home / Self Care | Payer: Medicare Other | Attending: Emergency Medicine | Admitting: Emergency Medicine

## 2017-01-12 DIAGNOSIS — I10 Essential (primary) hypertension: Secondary | ICD-10-CM | POA: Diagnosis not present

## 2017-01-12 DIAGNOSIS — Z79899 Other long term (current) drug therapy: Secondary | ICD-10-CM | POA: Diagnosis not present

## 2017-01-12 DIAGNOSIS — M791 Myalgia: Secondary | ICD-10-CM | POA: Diagnosis not present

## 2017-01-12 DIAGNOSIS — M7918 Myalgia, other site: Secondary | ICD-10-CM

## 2017-01-12 LAB — BASIC METABOLIC PANEL
ANION GAP: 7 (ref 5–15)
BUN: 11 mg/dL (ref 6–20)
CALCIUM: 9.4 mg/dL (ref 8.9–10.3)
CO2: 30 mmol/L (ref 22–32)
Chloride: 101 mmol/L (ref 101–111)
Creatinine, Ser: 0.86 mg/dL (ref 0.61–1.24)
GFR calc Af Amer: >60 mL/min (ref >60)
GLUCOSE: 102 mg/dL — AB (ref 65–99)
Potassium: 4 mmol/L (ref 3.5–5.1)
Sodium: 138 mmol/L (ref 135–145)

## 2017-01-12 LAB — CBC
HEMATOCRIT: 43.5 % (ref 40.0–52.0)
Hemoglobin: 14.9 g/dL (ref 13.0–18.0)
MCH: 30.3 pg (ref 26.0–34.0)
MCHC: 34.3 g/dL (ref 32.0–36.0)
MCV: 88.2 fL (ref 80.0–100.0)
PLATELETS: 260 10*3/uL (ref 150–440)
RBC: 4.93 MIL/uL (ref 4.40–5.90)
RDW: 13.1 % (ref 11.5–14.5)
WBC: 5.6 10*3/uL (ref 3.8–10.6)

## 2017-01-12 LAB — CK: Total CK: 114 U/L (ref 49–397)

## 2017-01-12 MED ORDER — KETOROLAC TROMETHAMINE 60 MG/2ML IM SOLN
60.0000 mg | Freq: Once | INTRAMUSCULAR | Status: AC
  Administered 2017-01-12: 60 mg via INTRAMUSCULAR
  Filled 2017-01-12: qty 2

## 2017-01-12 MED ORDER — OXYCODONE-ACETAMINOPHEN 5-325 MG PO TABS
1.0000 | ORAL_TABLET | Freq: Once | ORAL | Status: AC
  Administered 2017-01-12: 1 via ORAL
  Filled 2017-01-12: qty 1

## 2017-01-12 NOTE — Discharge Instructions (Signed)
Please follow up with your primary care physician for evaluation of this pain.

## 2017-01-12 NOTE — ED Triage Notes (Signed)
Patient reports bilateral leg pain, reports history of chronic pain from past injury but has gotten worse over the past few days even with prescribed medications.  Patient is ambulatory without difficulty.

## 2017-01-12 NOTE — ED Provider Notes (Signed)
[facility] Emergency Department Provider Note   ____________________________________________   First MD Initiated Contact with Patient 01/12/17 [phone]     (approximate)  I have reviewed the triage vital signs and the nursing notes.   HISTORY  Chief Complaint Leg Pain    HPI Stanley Baxter. is a 76 y.o. male Who comes into the hospital today claiming that he has pain from head to toe. He reports his legs are jumping and hurting and he can't sleep. The patient denies any chest pain or shortness of breath. He states that this pain started last week and he thought it would get better but it has not. He states that he cannot bear it anymore so he called his daughter to bring him into the hospital. He states that the pain is in his legs feet and lower back and arms. It is achy. He is unsure if he has arthritis but states that he has had pain like this in the past. He was told it was due to his wounds from previous injuries and he went to poor. He took some Aleve but he states that it didn't help. The patient rates pain a 10 out of 10 in intensity. He has no nausea no vomiting or diarrhea no constipation or dizzy have any abdominal pain. He is here today for treatment and evaluation.   Past Medical History:  Diagnosis Date  . Abnormal echocardiogram 2016   Dr. Juliann Pares  . Abscess after procedure   . Anxiety   . DDD (degenerative disc disease), lumbar 09/20/2013  . Hyperlipidemia, unspecified 09/20/2013  . Hypertension     Patient Active Problem List   Diagnosis Date Noted  . Anxiety   . Abnormal echocardiogram 04/27/2014  . DDD (degenerative disc disease), lumbar 09/20/2013  . HTN (hypertension) 09/20/2013  . Hyperlipidemia, unspecified 09/20/2013    Past Surgical History:  Procedure Laterality Date  . HERNIA REPAIR  2008   Dr. Excell Seltzer    Prior to Admission medications   Medication Sig Start Date End Date Taking? Authorizing Provider  lisinopril  (PRINIVIL,ZESTRIL) 20 MG tablet Take 10 mg by mouth daily.    [provider]  pregabalin (LYRICA) 150 MG capsule Take 150 mg by mouth 2 (two) times daily.    [provider]    Allergies Patient has no known allergies.  Family History  Problem Relation Age of Onset  . Cancer Mother   . Liver disease Father     Social History Social History  Substance Use Topics  . Smoking status: Never Smoker  . Smokeless tobacco: Never Used  . Alcohol use Yes    Review of Systems  Constitutional: No fever/chills Eyes: No visual changes. ENT: No sore throat. Cardiovascular: Denies chest pain. Respiratory: Denies shortness of breath. Gastrointestinal: No abdominal pain.  No nausea, no vomiting.  No diarrhea.  No constipation. Genitourinary: Negative for dysuria. Musculoskeletal: arm, leg and back pain. Skin: Negative for rash. Neurological: Negative for headaches, focal weakness or numbness.   ____________________________________________   PHYSICAL EXAM:  VITAL SIGNS: ED Triage Vitals  Enc Vitals Group     BP 01/12/17 0351 (!) 152/73     Pulse Rate 01/12/17 0351 78     Resp 01/12/17 0351 20     Temp 01/12/17 0351 98.1 F (36.7 C)     Temp Source 01/12/17 0351 Oral     SpO2 01/12/17 0351 94 %     Weight 01/12/17 0352 196 lb (88.9 kg)  Height 01/12/17 0352  (1.651 m)     Head Circumference --      Peak Flow --      Pain Score 01/12/17 0501 10     Pain Loc --      Pain Edu? --      Excl. in GC? --     Constitutional: Alert and oriented. Well appearing and in moderate distress. Eyes: Conjunctivae are normal. PERRL. EOMI. Head: Atraumatic. Nose: No congestion/rhinnorhea. Mouth/Throat: Mucous membranes are moist.  Oropharynx non-erythematous. Cardiovascular: Normal rate, regular rhythm. Grossly normal heart sounds.  Good peripheral circulation. Respiratory: Normal respiratory effort.  No retractions. Lungs CTAB. Gastrointestinal: Soft and  nontender. No distention. Positive bowel sounds Musculoskeletal: No lower extremity tenderness nor edema. Tenderness to palpation of lower extremities bilaterally Neurologic:  Normal speech and language.  Skin:  Skin is warm, dry and intact.  Psychiatric: Mood and affect are normal.   ____________________________________________   LABS (all labs ordered are listed, but only abnormal results are displayed)  Labs Reviewed  BASIC METABOLIC PANEL - Abnormal; Notable for the following:       Result Value   Glucose, Bld 102 (*)    All other components within normal limits  CBC  CK   ____________________________________________  EKG  none ____________________________________________  RADIOLOGY  No results found.  ____________________________________________   PROCEDURES  Procedure(s) performed: None  Procedures  Critical Care performed: No  ____________________________________________   INITIAL IMPRESSION / ASSESSMENT AND PLAN / ED COURSE  Pertinent labs & imaging results that were available during my care of the patient were reviewed by me and considered in my medical decision making (see chart for details).  This is a 76 year old who comes into the hospital today with pain in his arms and legs and back. Given his age I will check a CBC and a CMP as well as a CK with a concern for possible rhabdomyolysis. The patient is not having pain in his joint pain in his muscles. He'll receive a dose of Toradol as well as a dose of Percocet. I will reassess the patient once I received all of his results.     The patient's blood work is unremarkable. He will be discharged home to follow-up with his primary care physician. ____________________________________________   FINAL CLINICAL IMPRESSION(S) / ED DIAGNOSES  Final diagnoses:  Musculoskeletal pain      NEW MEDICATIONS STARTED DURING THIS VISIT:  New Prescriptions   No medications on file     Note:  This  document was prepared using Dragon voice recognition software and may include unintentional dictation errors.    Rebecka Apley, MD 01/12/17 [phone]

## 2018-03-03 ENCOUNTER — Other Ambulatory Visit: Payer: Self-pay

## 2018-03-03 ENCOUNTER — Emergency Department: Payer: Medicare Other

## 2018-03-03 ENCOUNTER — Emergency Department
Admission: EM | Admit: 2018-03-03 | Discharge: 2018-03-03 | Disposition: A | Discharge status: Home / Self Care | Payer: Medicare Other | Attending: Emergency Medicine | Admitting: Emergency Medicine

## 2018-03-03 DIAGNOSIS — Z79899 Other long term (current) drug therapy: Secondary | ICD-10-CM | POA: Diagnosis not present

## 2018-03-03 DIAGNOSIS — I10 Essential (primary) hypertension: Secondary | ICD-10-CM | POA: Diagnosis not present

## 2018-03-03 DIAGNOSIS — M5431 Sciatica, right side: Secondary | ICD-10-CM | POA: Diagnosis not present

## 2018-03-03 DIAGNOSIS — M545 Low back pain: Secondary | ICD-10-CM | POA: Diagnosis present

## 2018-03-03 MED ORDER — LIDOCAINE 5 % EX PTCH: 1.0000 | MEDICATED_PATCH | Freq: Two times a day (BID) | CUTANEOUS | 0 refills | Status: AC

## 2018-03-03 MED ORDER — METHYLPREDNISOLONE 4 MG PO TBPK: ORAL_TABLET | ORAL | 0 refills | Status: DC

## 2018-03-03 MED ORDER — LIDOCAINE 5 % EX PTCH
1.0000 | MEDICATED_PATCH | CUTANEOUS | Status: DC
  Administered 2018-03-03: 1 via TRANSDERMAL
  Filled 2018-03-03: qty 1

## 2018-03-03 NOTE — ED Triage Notes (Signed)
Pt c/o right lower back pain that radiates into the buttock and leg since Sunday. Denies injury.

## 2018-03-03 NOTE — Discharge Instructions (Addendum)
[facility]

## 2018-03-03 NOTE — ED Provider Notes (Signed)
[facility] Emergency Department Provider Note   ____________________________________________   First MD Initiated Contact with Patient 03/03/18 1302     (approximate)  I have reviewed the triage vital signs and the nursing notes.   HISTORY  Chief Complaint Back Pain    HPI Stanley Baxter. is a 77 y.o. male patient complain of 4 days of right lateral back pain with radicular component to the right lower extremity.  Patient the pain radiates to mid posterior right thigh.  Patient denies bladder bowel dysfunction.  Patient denies provocative incident for complaint.  Patient rates the pain as a 10/10.  No palliative measures for complaint.   Past Medical History:  Diagnosis Date  . Abnormal echocardiogram 2016   Dr. Juliann Pares  . Abscess after procedure   . Anxiety   . DDD (degenerative disc disease), lumbar 09/20/2013  . Hyperlipidemia, unspecified 09/20/2013  . Hypertension     Patient Active Problem List   Diagnosis Date Noted  . Anxiety   . Abnormal echocardiogram 04/27/2014  . DDD (degenerative disc disease), lumbar 09/20/2013  . HTN (hypertension) 09/20/2013  . Hyperlipidemia, unspecified 09/20/2013    Past Surgical History:  Procedure Laterality Date  . HERNIA REPAIR  2008   Dr. Excell Seltzer    Prior to Admission medications   Medication Sig Start Date End Date Taking? Authorizing Provider  lidocaine (LIDODERM) 5 % Place 1 patch onto the skin every 12 (twelve) hours. Remove & Discard patch within 12 hours or as directed by MD 03/03/18 03/03/19  Joni Reining, PA-C  lisinopril (PRINIVIL,ZESTRIL) 20 MG tablet Take 10 mg by mouth daily.    [provider]  methylPREDNISolone (MEDROL DOSEPAK) 4 MG TBPK tablet Take Tapered dose as directed 03/03/18   Joni Reining, PA-C  pregabalin (LYRICA) 150 MG capsule Take 150 mg by mouth 2 (two) times daily.    [provider]    Allergies Patient has no known allergies.  Family  History  Problem Relation Age of Onset  . Cancer Mother   . Liver disease Father     Social History Social History   Tobacco Use  . Smoking status: Never Smoker  . Smokeless tobacco: Never Used  Substance Use Topics  . Alcohol use: Yes  . Drug use: No    Review of Systems Constitutional: No fever/chills Eyes: No visual changes. ENT: No sore throat. Cardiovascular: Denies chest pain. Respiratory: Denies shortness of breath. Gastrointestinal: No abdominal pain.  No nausea, no vomiting.  No diarrhea.  No constipation. Genitourinary: Negative for dysuria. Musculoskeletal: Positive for back pain. Skin: Negative for rash. Neurological: Negative for headaches, focal weakness or numbness. Psychiatric:Anxiety. Endocrine:Hyperlipidemia and hypertension.   ____________________________________________   PHYSICAL EXAM:  VITAL SIGNS: ED Triage Vitals  Enc Vitals Group     BP 03/03/18 1249 140/62     Pulse Rate 03/03/18 1249 92     Resp 03/03/18 1248 17     Temp 03/03/18 1248 97.9 F (36.6 C)     Temp Source 03/03/18 1248 Oral     SpO2 03/03/18 1249 95 %     Weight 03/03/18 1248 180 lb (81.6 kg)     Height 03/03/18 1248 5\' 4"  (1.626 m)     Head Circumference --      Peak Flow --      Pain Score 03/03/18 1248 10     Pain Loc --      Pain Edu? --      Excl.  in [place]? --     Constitutional: Alert and oriented. Well appearing and in no acute distress. Cardiovascular: Normal rate, regular rhythm. Grossly normal heart sounds.  Good peripheral circulation. Respiratory: Normal respiratory effort.  No retractions. Lungs CTAB. Gastrointestinal: Soft and nontender. No distention. No abdominal bruits. No CVA tenderness. Genitourinary: Deferred Musculoskeletal: No lower extremity tenderness nor edema.  No joint effusions. Neurologic:  Normal speech and language. No gross focal neurologic deficits are appreciated. No gait instability. Skin:  Skin is warm, dry and intact. No rash  noted. Psychiatric: Mood and affect are normal. Speech and behavior are normal.  ____________________________________________   LABS (all labs ordered are listed, but only abnormal results are displayed)  Labs Reviewed - No data to display ____________________________________________  EKG   ____________________________________________  RADIOLOGY  ED MD interpretation:    Official radiology report(s): Dg Lumbar Spine Complete  Result Date: 03/03/2018 CLINICAL DATA:  Low back pain.  Radiates into the buttock and leg EXAM: LUMBAR SPINE - COMPLETE 4+ VIEW COMPARISON:  CT abdomen 06/19/2016 FINDINGS: There is no evidence of lumbar spine fracture. 2 mm retrolisthesis of L3 on L4. Degenerative disc disease mild disc height loss at L1-2, L2-3, L4-5 and L5-S1. Bilateral facet arthropathy at L4-5 and L5-S1. Abdominal aortic atherosclerosis. IMPRESSION: Mild lumbar spine spondylosis. Electronically Signed   By: Elige Ko   On: 03/03/2018 13:57    ____________________________________________   PROCEDURES  Procedure(s) performed: None  Procedures  Critical Care performed: No  ____________________________________________   INITIAL IMPRESSION / ASSESSMENT AND PLAN / ED COURSE  As part of my medical decision making, I reviewed the following data within the electronic MEDICAL RECORD NUMBER    Patient presented radicular back pain consistent with sciatica.  Discussed x-ray findings with patient.  Patient given discharge care instructions.  Patient given prescription for Lidoderm patches and a Medrol Dosepak.  [facility]      ____________________________________________   FINAL CLINICAL IMPRESSION(S) / ED DIAGNOSES  Final diagnoses:  Sciatica of right side     ED Discharge Orders         Ordered    methylPREDNISolone (MEDROL DOSEPAK) 4 MG TBPK tablet     03/03/18 1412     lidocaine (LIDODERM) 5 %  Every 12 hours     03/03/18 1412           Note:  This document was prepared using Dragon voice recognition software and may include unintentional dictation errors.    Joni Reining, PA-C 03/03/18 1415    Nita Sickle, MD 03/08/18 1343

## 2019-03-09 ENCOUNTER — Other Ambulatory Visit: Payer: Self-pay

## 2019-03-09 ENCOUNTER — Encounter: Payer: Self-pay | Admitting: Emergency Medicine

## 2019-03-09 ENCOUNTER — Emergency Department
Admission: EM | Admit: 2019-03-09 | Discharge: 2019-03-09 | Disposition: A | Discharge status: Home / Self Care | Payer: Medicare Other | Attending: Emergency Medicine | Admitting: Emergency Medicine

## 2019-03-09 DIAGNOSIS — I10 Essential (primary) hypertension: Secondary | ICD-10-CM | POA: Diagnosis not present

## 2019-03-09 DIAGNOSIS — M545 Low back pain: Secondary | ICD-10-CM | POA: Diagnosis present

## 2019-03-09 DIAGNOSIS — M5441 Lumbago with sciatica, right side: Secondary | ICD-10-CM | POA: Diagnosis not present

## 2019-03-09 DIAGNOSIS — Z79899 Other long term (current) drug therapy: Secondary | ICD-10-CM | POA: Insufficient documentation

## 2019-03-09 MED ORDER — ONDANSETRON 4 MG PO TBDP
4.0000 mg | ORAL_TABLET | Freq: Once | ORAL | Status: AC
  Administered 2019-03-09: 4 mg via ORAL
  Filled 2019-03-09: qty 1

## 2019-03-09 MED ORDER — PREDNISONE 20 MG PO TABS
60.0000 mg | ORAL_TABLET | Freq: Once | ORAL | Status: AC
  Administered 2019-03-09: 17:00:00 60 mg via ORAL
  Filled 2019-03-09: qty 3

## 2019-03-09 MED ORDER — HYDROCODONE-ACETAMINOPHEN 5-325 MG PO TABS
1.0000 | ORAL_TABLET | Freq: Once | ORAL | Status: AC
  Administered 2019-03-09: 17:00:00 1 via ORAL
  Filled 2019-03-09: qty 1

## 2019-03-09 MED ORDER — PREDNISONE 10 MG (21) PO TBPK: ORAL_TABLET | ORAL | 0 refills | Status: DC

## 2019-03-09 NOTE — ED Triage Notes (Signed)
Pt c/o right lower back pain radiating down leg.  Worse with movement.  Started after changing tire on boat trailor and then he also lifted the battery out of the boat to change it.  No loss bowel or bladder.

## 2019-03-09 NOTE — ED Provider Notes (Signed)
[facility] Emergency Department Provider Note  ____________________________________________  Time seen: Approximately 4:45 PM  I have reviewed the triage vital signs and the nursing notes.   HISTORY  Chief Complaint Back Pain    HPI Stanley GandyWillie Majewski Jr. is a 78 y.o. male presents to the emergency department with right-sided low back pain that radiates into the right buttocks that is occurred for the past 2 days after patient has been engaging in heavy lifting.  Patient reports that he changed a tire and lifted a heavy car battery.  He denies bowel or bladder incontinence or saddle anesthesia.  He denies fever.  No dysuria, hematuria or increased urinary frequency.  Patient states that he has been able to ambulate.        Past Medical History:  Diagnosis Date  . Abnormal echocardiogram 2016   Dr. Juliann Pa[place]wood  . Abscess after procedure   . Anxiety   . DDD (degenerative disc disease), lumbar 09/20/2013  . Hyperlipidemia, unspecified 09/20/2013  . Hypertension     Patient Active Problem List   Diagnosis Date Noted  . Anxiety   . Abnormal echocardiogram 04/27/2014  . DDD (degenerative disc disease), lumbar 09/20/2013  . HTN (hypertension) 09/20/2013  . Hyperlipidemia, unspecified 09/20/2013    Past Surgical History:  Procedure Laterality Date  . HERNIA REPAIR  2008   Dr. Excell Seltzerooper    Prior to Admission medications   Medication Sig Start Date End Date Taking? Authorizing Provider  acetaminophen (TYLENOL) 325 MG tablet Take 650 mg by mouth every 6 (six) hours as needed for mild pain.   Yes [provider]  clonazePAM (KLONOPIN) 1 MG tablet Take 1 mg by mouth 2 (two) times daily as needed for anxiety.   Yes [provider]  diclofenac Sodium (VOLTAREN) 1 % GEL Apply 2-4 g topically 4 (four) times daily.   Yes [provider]  fluticasone (FLONASE) 50 MCG/ACT nasal spray Place 1 spray into both nostrils daily.   Yes [provider]  lisinopril (ZESTRIL) 40 MG tablet Take 20 mg by mouth daily.    Yes [provider]  pregabalin (LYRICA) 150 MG capsule Take 150 mg by mouth 2 (two) times daily.   Yes [provider]  sildenafil (VIAGRA) 100 MG tablet Take 100 mg by mouth daily as needed for erectile dysfunction.   Yes [provider]  sodium chloride (OCEAN) 0.65 % nasal spray Place 1 spray into the nose as needed for congestion.   Yes [provider]  methylPREDNISolone (MEDROL DOSEPAK) 4 MG TBPK tablet Take Tapered dose as directed Patient not taking: Reported on 03/09/2019 03/03/18   Joni ReiningSmith, Ronald K, PA-C  predniSONE (STERAPRED UNI-PAK 21 TAB) 10 MG (21) TBPK tablet Take 6 tablets the first day, take 5 tablets the second day, take 4 tablets the third day, take 3 tablets the fourth day, take 2 tablets the fifth day, take 1 tablet the sixth day. 03/09/19   Orvil FeilWoods, Marcellus Pulliam M, PA-C    Allergies Patient has no known allergies.  Family History  Problem Relation Age of Onset  . Cancer Mother   . Liver disease Father     Social History Social History   Tobacco Use  . Smoking status: Never Smoker  . Smokeless tobacco: Never Used  Substance Use Topics  . Alcohol use: Yes  . Drug use: No     Review of Systems  Constitutional: No fever/chills Eyes: No visual changes. No discharge ENT: No upper respiratory complaints.  Cardiovascular: no chest pain. Respiratory: no cough. No SOB. Gastrointestinal: No abdominal pain.  No nausea, no vomiting.  No diarrhea.  No constipation. Genitourinary: Negative for dysuria. No hematuria Musculoskeletal: Patient has low back pain.  Skin: Negative for rash, abrasions, lacerations, ecchymosis. Neurological: Negative for headaches, focal weakness or numbness.   ____________________________________________   PHYSICAL EXAM:  VITAL SIGNS: ED Triage Vitals  Enc Vitals Group     BP 03/09/19 1512 (!) 110/49     Pulse Rate 03/09/19  1512 75     Resp 03/09/19 1512 18     Temp 03/09/19 1512 98.9 F (37.2 C)     Temp Source 03/09/19 1512 Oral     SpO2 03/09/19 1512 94 %     Weight 03/09/19 1509 180 lb (81.6 kg)     Height 03/09/19 1509 5\' 5"  (1.651 m)     Head Circumference --      Peak Flow --      Pain Score 03/09/19 1509 10     Pain Loc --      Pain Edu? --      Excl. in [place]? --      Constitutional: Alert and oriented. Well appearing and in no acute distress. Eyes: Conjunctivae are normal. PERRL. EOMI. Head: Atraumatic. ENT: Cardiovascular: Normal rate, regular rhythm. Normal S1 and S2.  Good peripheral circulation. Respiratory: Normal respiratory effort without tachypnea or retractions. Lungs CTAB. Good air entry to the bases with no decreased or absent breath sounds. Gastrointestinal: Bowel sounds 4 quadrants. Soft and nontender to palpation. No guarding or rigidity. No palpable masses. No distention. No CVA tenderness. Musculoskeletal: Full range of motion to all extremities. No gross deformities appreciated.  Patient has paraspinal muscle tenderness on the right and a positive straight leg raise test on the right. Neurologic:  Normal speech and language. No gross focal neurologic deficits are appreciated.  Skin:  Skin is warm, dry and intact. No rash noted. Psychiatric: Mood and affect are normal. Speech and behavior are normal. Patient exhibits appropriate insight and judgement.   ____________________________________________   LABS (all labs ordered are listed, but only abnormal results are displayed)  Labs Reviewed - No data to display ____________________________________________  EKG   ____________________________________________  RADIOLOGY   No results found.  ____________________________________________    PROCEDURES  Procedure(s) performed:    Procedures    Medications  HYDROcodone-acetaminophen (NORCO/VICODIN) 5-325 MG per tablet 1 tablet (has no administration in time  range)  ondansetron (ZOFRAN-ODT) disintegrating tablet 4 mg (has no administration in time range)  predniSONE (DELTASONE) tablet 60 mg (has no administration in time range)     ____________________________________________   INITIAL IMPRESSION / ASSESSMENT AND PLAN / ED COURSE  Pertinent labs & imaging results that were available during my care of the patient were reviewed by me and considered in my medical decision making (see chart for details).  Review of the [place] CSRS was performed in accordance of the [place] prior to dispensing any controlled drugs.         Assessment and Plan: Low back pain 78 year old male presents to the emergency department with right-sided low back pain that radiates into the buttocks and down the posterior aspect of the right leg for the past 2 days after patient engaged in heavy lifting.  On physical exam, patient had some paraspinal muscle tenderness on the right and a positive straight leg raise test.  He was otherwise neurovascularly intact.  Patient lives independently and I am concerned about fall  risk.  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  Patient was sent home with a tapered steroid.  I do not feel like a muscle relaxer is appropriate at this time given risk for falls.  Patient was advised to follow-up with primary care if pain persist.  All patient questions were answered.   ____________________________________________  FINAL CLINICAL IMPRESSION(S) / ED DIAGNOSES  Final diagnoses:  Acute right-sided low back pain with right-sided sciatica      NEW MEDICATIONS STARTED DURING THIS VISIT:  ED Discharge Orders         Ordered    predniSONE (STERAPRED UNI-PAK 21 TAB) 10 MG (21) TBPK tablet     03/09/19 1639              This chart was dictated using voice recognition software/Dragon. Despite best efforts to proofread, errors can occur which can change the meaning. Any change was  purely unintentional.    Orvil Feil, PA-C 03/09/19 1648    Shaune Pollack, MD 03/09/19 [phone]

## 2019-03-09 NOTE — ED Notes (Signed)
See triage note  Presents with lower back which is moving into right leg  Developed pain after changing a tire on a trailer and also removing the battery  Ambulates with slight limp d/t pain

## 2019-03-19 ENCOUNTER — Other Ambulatory Visit: Payer: Self-pay

## 2019-03-19 ENCOUNTER — Emergency Department
Admission: EM | Admit: 2019-03-19 | Discharge: 2019-03-19 | Disposition: A | Discharge status: Home / Self Care | Payer: Medicare Other | Attending: Student in an Organized Health Care Education/Training Program | Admitting: Student in an Organized Health Care Education/Training Program

## 2019-03-19 ENCOUNTER — Emergency Department: Payer: Medicare Other

## 2019-03-19 ENCOUNTER — Encounter: Payer: Self-pay | Admitting: Emergency Medicine

## 2019-03-19 DIAGNOSIS — M545 Low back pain, unspecified: Secondary | ICD-10-CM

## 2019-03-19 DIAGNOSIS — Z79899 Other long term (current) drug therapy: Secondary | ICD-10-CM | POA: Diagnosis not present

## 2019-03-19 DIAGNOSIS — I10 Essential (primary) hypertension: Secondary | ICD-10-CM | POA: Insufficient documentation

## 2019-03-19 LAB — URINALYSIS, ROUTINE W REFLEX MICROSCOPIC
Bilirubin Urine: NEGATIVE
Glucose, UA: NEGATIVE mg/dL
Hgb urine dipstick: NEGATIVE
Ketones, ur: NEGATIVE mg/dL
Leukocytes,Ua: NEGATIVE
Nitrite: NEGATIVE
Protein, ur: NEGATIVE mg/dL
Specific Gravity, Urine: 1.011 (ref 1.005–1.030)
pH: 6 (ref 5.0–8.0)

## 2019-03-19 MED ORDER — CYCLOBENZAPRINE HCL 5 MG PO TABS: 5.0000 mg | ORAL_TABLET | Freq: Three times a day (TID) | ORAL | 0 refills | Status: DC | PRN

## 2019-03-19 MED ORDER — CYCLOBENZAPRINE HCL 10 MG PO TABS
10.0000 mg | ORAL_TABLET | Freq: Once | ORAL | Status: AC
Start: 2019-03-19 — End: 2019-03-19
  Administered 2019-03-19: 10 mg via ORAL
  Filled 2019-03-19: qty 1

## 2019-03-19 MED ORDER — KETOROLAC TROMETHAMINE 30 MG/ML IJ SOLN
30.0000 mg | Freq: Once | INTRAMUSCULAR | Status: AC
  Administered 2019-03-19: 30 mg via INTRAMUSCULAR
  Filled 2019-03-19: qty 1

## 2019-03-19 MED ORDER — MELOXICAM 15 MG PO TABS: 15.0000 mg | ORAL_TABLET | Freq: Every day | ORAL | 0 refills | Status: AC

## 2019-03-19 MED ORDER — TRAMADOL HCL 50 MG PO TABS: 50.0000 mg | ORAL_TABLET | Freq: Three times a day (TID) | ORAL | 0 refills | Status: AC | PRN

## 2019-03-19 NOTE — ED Provider Notes (Signed)
[facility] Emergency Department Provider Note ____________________________________________  Time seen: 1015  I have reviewed the triage vital signs and the nursing notes.  HISTORY  Chief Complaint  Back Pain  HPI Stanley Baxter. is a 78 y.o. male presents to the ED accompanied by his adult daughter, for evaluation of ongoing low back pain right lower extremity referral.  Patient was evaluated here about 2 weeks prior, for similar complaints.  He describes onset was after he had attempted to change a tire on his boat trailer.  Since that time he has had ongoing pain to the right lower back and down the posterior right leg.  He denies any bladder or bowel incontinence, saddle anesthesias, or foot drop.  He also denies any interim trauma or falls.   His medical history is consistent with a visit about 1 year prior, for similar complaints.  At that time an x-ray revealed multilevel DDD and mild retrolisthesis.  Patient denies any interim treatment.  He reports the pain is significant at this time, and interferes with his ability to rest and sleep.  Past Medical History:  Diagnosis Date  . Abnormal echocardiogram 2016   Dr. Clayborn Bigness  . Abscess after procedure   . Anxiety   . DDD (degenerative disc disease), lumbar 09/20/2013  . Hyperlipidemia, unspecified 09/20/2013  . Hypertension     Patient Active Problem List   Diagnosis Date Noted  . Anxiety   . Abnormal echocardiogram 04/27/2014  . DDD (degenerative disc disease), lumbar 09/20/2013  . HTN (hypertension) 09/20/2013  . Hyperlipidemia, unspecified 09/20/2013    Past Surgical History:  Procedure Laterality Date  . HERNIA REPAIR  2008   Dr. Burt Knack    Prior to Admission medications   Medication Sig Start Date End Date Taking? Authorizing Provider  acetaminophen (TYLENOL) 325 MG tablet Take 650 mg by mouth every 6 (six) hours as needed for mild pain.    [provider]  clonazePAM (KLONOPIN) 1 MG  tablet Take 1 mg by mouth 2 (two) times daily as needed for anxiety.    [provider]  cyclobenzaprine (FLEXERIL) 5 MG tablet Take 1 tablet (5 mg total) by mouth 3 (three) times daily as needed (Take 0.5 to 1 tab TID prn spasms). 03/19/19   Varnell Orvis, Dannielle Karvonen, PA-C  diclofenac Sodium (VOLTAREN) 1 % GEL Apply 2-4 g topically 4 (four) times daily.    [provider]  fluticasone (FLONASE) 50 MCG/ACT nasal spray Place 1 spray into both nostrils daily.    [provider]  lisinopril (ZESTRIL) 40 MG tablet Take 20 mg by mouth daily.     [provider]  meloxicam (MOBIC) 15 MG tablet Take 1 tablet (15 mg total) by mouth daily. 03/19/19 04/18/19  Jule Schlabach, Dannielle Karvonen, PA-C  pregabalin (LYRICA) 150 MG capsule Take 150 mg by mouth 2 (two) times daily.    [provider]  sildenafil (VIAGRA) 100 MG tablet Take 100 mg by mouth daily as needed for erectile dysfunction.    [provider]  sodium chloride (OCEAN) 0.65 % nasal spray Place 1 spray into the nose as needed for congestion.    [provider]  traMADol (ULTRAM) 50 MG tablet Take 1 tablet (50 mg total) by mouth 3 (three) times daily as needed for up to 5 days. 03/19/19 03/24/19  Romina Divirgilio, Dannielle Karvonen, PA-C    Allergies Patient has no known allergies.  Family History  Problem Relation Age of Onset  .  Cancer Mother   . Liver disease Father     Social History Social History   Tobacco Use  . Smoking status: Never Smoker  . Smokeless tobacco: Never Used  Substance Use Topics  . Alcohol use: Yes  . Drug use: No    Review of Systems  Constitutional: Negative for fever. Cardiovascular: Negative for chest pain. Respiratory: Negative for shortness of breath. Gastrointestinal: Negative for abdominal pain, vomiting and diarrhea. Genitourinary: Negative for dysuria. Musculoskeletal: Positive for back pain. RLE referral as above Skin: Negative for rash. Neurological:  Negative for headaches, focal weakness or numbness. ____________________________________________  PHYSICAL EXAM:  VITAL SIGNS: ED Triage Vitals  Enc Vitals Group     BP 03/19/19 0910 (!) 103/45     Pulse Rate 03/19/19 0910 94     Resp 03/19/19 0910 16     Temp 03/19/19 0910 99.1 F (37.3 C)     Temp Source 03/19/19 0910 Oral     SpO2 03/19/19 0910 94 %     Weight 03/19/19 0908 165 lb (74.8 kg)     Height 03/19/19 0908 5\' 4"  (1.626 m)     Head Circumference --      Peak Flow --      Pain Score 03/19/19 0908 10     Pain Loc --      Pain Edu? --      Excl. in [place]? --     Constitutional: Alert and oriented. Well appearing and in no distress. Head: Normocephalic and atraumatic. Eyes: Conjunctivae are normal. Normal extraocular movements Cardiovascular: Normal rate, regular rhythm. Normal distal pulses. Respiratory: Normal respiratory effort. No wheezes/rales/rhonchi. Gastrointestinal: Soft and nontender. No distention, rebound, guarding, or rigidity.  No CVA tenderness elicited.   Musculoskeletal: Normal spinal alignment without midline tenderness, spasm, deformity, or step-off.  Patient transitions from supine to sit without assistance.  Normal hip flexion, extension, and rotational range of motion noted bilaterally.  Normal lumbar flexion and extension range with transition from sit to stand.  Normal heel raise and toe raise on exam.  Nontender with normal range of motion in all extremities.  Neurologic: Cranial nerves II through XII grossly intact.  Normal LE DTRs bilaterally.  Normal toe dorsiflexion bilaterally.  Negative supine straight leg raise bilaterally.  Negative Trendelenburg.  Normal gait without ataxia. Normal speech and language. No gross focal neurologic deficits are appreciated. Skin:  Skin is warm, dry and intact. No rash noted. Psychiatric: Mood and affect are normal. Patient exhibits appropriate insight and judgment. ____________________________________________    LABS (pertinent positives/negatives)  Labs Reviewed  URINALYSIS, ROUTINE W REFLEX MICROSCOPIC - Abnormal; Notable for the following components:      Result Value   Color, Urine YELLOW (*)    APPearance CLEAR (*)    All other components within normal limits  ____________________________________________   RADIOLOGY  CT Lumbar Spine  IMPRESSION: 1. 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 Findings are most pronounced at L2-3 where there it appears to be moderate canal stenosis with moderate bilateral foraminal stenosis. There is also moderate canal stenosis at the L3-4 level. 2. Mild diffuse urinary bladder wall thickening. Correlate for cystitis. 3. 5 mm rounded metallic foreign body within the subcutaneous soft tissues overlying the medial aspect of the left gluteal musculature. ____________________________________________  PROCEDURES  Toradol 30 mg IM Flexeril 10 mg PO Procedures ____________________________________________  INITIAL IMPRESSION / ASSESSMENT AND PLAN / ED COURSE  Geriatric patient with ED evaluation of persistent right-sided low back  pain and radicular symptoms.  Patient's exam is overall benign without any signs of acute neuromuscular deficit or cauda equina syndrome.  CT scan of the lumbar spine does reveal multilevel degenerative disc disease, now stenosis, and bilateral foraminal stenosis.  Symptoms likely represent radiculopathy related to his underlying chronic arthropathy.  Patient reports improvement of his symptoms after ED medication ministration of anti-inflammatories and muscle relaxant.  He will be discharged with a prescription for cyclobenzaprine, meloxicam, and Ultram to take as directed.  He is encouraged to follow-up with primary provider for ongoing symptoms.  Return to the ED for sharply worsening symptoms as discussed.  Patient is discharged to the care of his adult daughter at this time, and both  verbalized understanding of discharge instructions.  Berneice GandyWillie Mizzell Jr. was evaluated in Emergency Department on 03/19/2019 for the symptoms described in the history of present illness. He was evaluated in the context of the global COVID-19 pandemic, which necessitated consideration that the patient might be at risk for infection with the SARS-CoV-2 virus that causes COVID-19. Institutional protocols and algorithms that pertain to the evaluation of patients at risk for COVID-19 are in a state of rapid change based on information released by regulatory bodies including the CDC and federal and state organizations. These policies and algorithms were followed during the patient's care in the ED. ____________________________________________  FINAL CLINICAL IMPRESSION(S) / ED DIAGNOSES  Final diagnoses:  Acute right-sided low back pain without sciatica      Karmen StabsMenshew, Charlesetta IvoryJenise V Bacon, PA-C 03/19/19 1829    Willy Eddyobinson, Patrick, MD 03/20/19 1510

## 2019-03-19 NOTE — ED Notes (Signed)
NAD noted at time of D/C. Pt taken to lobby via wheelchair at this time. Pt denies comments/concerns regarding D/C instructions.  

## 2019-03-19 NOTE — ED Notes (Signed)
Attempted to collect urine sample, pt unable to urinate at this time.

## 2019-03-19 NOTE — ED Triage Notes (Signed)
Pt c/o right lower back pain , was seen here last Friday and dx with sciatica. Pt states he cant sleep he is so much pain.Marland Kitchen

## 2019-03-19 NOTE — ED Notes (Signed)
Pt presents to ED via POV, pt states was seen 1 week ago and dx with sciatica to R side of of his back, pt states took Prednisone with relief, however is done and pain has returned full force. Pt states pain radiates down R leg. Pt states has been unable to sleep at this time.

## 2019-03-19 NOTE — ED Notes (Signed)
Pt able to stand with stand-by assist from this RN to use urinal to provide urine sample.

## 2019-03-19 NOTE — Discharge Instructions (Signed)
Take the prescription meds as directed. [facility] for ongoing [facility]. Your CT scan reports shows some degenerative disk disease, narrowing around the spinal cord & nerves, and arthritis. Return to the ED as needed.

## 2019-03-22 ENCOUNTER — Other Ambulatory Visit: Payer: Self-pay

## 2019-03-22 DIAGNOSIS — Z20822 Contact with and (suspected) exposure to covid-19: Secondary | ICD-10-CM

## 2019-03-24 LAB — NOVEL CORONAVIRUS, NAA: SARS-CoV-2, NAA: NOT DETECTED

## 2019-09-25 ENCOUNTER — Emergency Department: Payer: Medicare Other

## 2019-09-25 ENCOUNTER — Emergency Department
Admission: EM | Admit: 2019-09-25 | Discharge: 2019-09-25 | Disposition: A | Discharge status: Home / Self Care | Payer: Medicare Other | Attending: Emergency Medicine | Admitting: Emergency Medicine

## 2019-09-25 DIAGNOSIS — I10 Essential (primary) hypertension: Secondary | ICD-10-CM | POA: Insufficient documentation

## 2019-09-25 DIAGNOSIS — R079 Chest pain, unspecified: Secondary | ICD-10-CM

## 2019-09-25 DIAGNOSIS — Z79899 Other long term (current) drug therapy: Secondary | ICD-10-CM | POA: Insufficient documentation

## 2019-09-25 DIAGNOSIS — F419 Anxiety disorder, unspecified: Secondary | ICD-10-CM | POA: Diagnosis not present

## 2019-09-25 DIAGNOSIS — R0789 Other chest pain: Secondary | ICD-10-CM | POA: Insufficient documentation

## 2019-09-25 LAB — BASIC METABOLIC PANEL
Anion gap: 8 (ref 5–15)
BUN: 15 mg/dL (ref 8–23)
CO2: 28 mmol/L (ref 22–32)
Calcium: 9 mg/dL (ref 8.9–10.3)
Chloride: 103 mmol/L (ref 98–111)
Creatinine, Ser: 0.96 mg/dL (ref 0.61–1.24)
GFR calc Af Amer: >60 mL/min (ref >60)
GFR calc non Af Amer: >60 mL/min (ref >60)
Glucose, Bld: 116 mg/dL — ABNORMAL HIGH (ref 70–99)
Potassium: 3.9 mmol/L (ref 3.5–5.1)
Sodium: 139 mmol/L (ref 135–145)

## 2019-09-25 LAB — T4, FREE: Free T4: 0.87 ng/dL (ref 0.61–1.12)

## 2019-09-25 LAB — CBC
HCT: 41.9 % (ref 39.0–52.0)
Hemoglobin: 13.5 g/dL (ref 13.0–17.0)
MCH: 29.5 pg (ref 26.0–34.0)
MCHC: 32.2 g/dL (ref 30.0–36.0)
MCV: 91.7 fL (ref 80.0–100.0)
Platelets: 326 10*3/uL (ref 150–400)
RBC: 4.57 MIL/uL (ref 4.22–5.81)
RDW: 12.1 % (ref 11.5–15.5)
WBC: 7.6 10*3/uL (ref 4.0–10.5)
nRBC: 0 % (ref 0.0–0.2)

## 2019-09-25 LAB — TROPONIN I (HIGH SENSITIVITY)
Troponin I (High Sensitivity): 4 ng/L (ref <18)
Troponin I (High Sensitivity): 4 ng/L (ref <18)

## 2019-09-25 LAB — TSH: TSH: 1.276 u[IU]/mL (ref 0.350–4.500)

## 2019-09-25 MED ORDER — LORAZEPAM 0.5 MG PO TABS
0.5000 mg | ORAL_TABLET | Freq: Three times a day (TID) | ORAL | 0 refills | Status: DC | PRN
Start: 2019-09-25 — End: 2019-11-23

## 2019-09-25 MED ORDER — SODIUM CHLORIDE 0.9% FLUSH: 3.0000 mL | Freq: Once | INTRAVENOUS | Status: DC

## 2019-09-25 MED ORDER — LORAZEPAM 0.5 MG PO TABS
0.5000 mg | ORAL_TABLET | Freq: Once | ORAL | Status: AC
  Administered 2019-09-25: 0.5 mg via ORAL
  Filled 2019-09-25: qty 1

## 2019-09-25 NOTE — ED Notes (Signed)
Pt states was in the [place] war and has had anxiety since wartime. Pt states has been getting worse since covid. Pt is tearful at times while speaking with RN. Pt states he gets shaky at times and has some chest pain. Pt states [facility], but has been taken off his antianxiety medications.

## 2019-09-25 NOTE — Discharge Instructions (Addendum)
1.  You may take Lorazepam every 8 hours as needed for anxiety. 2.  Return to the ER for worsening symptoms, persistent vomiting, difficulty breathing or other concerns.

## 2019-09-25 NOTE — ED Triage Notes (Signed)
Patient to ED for nervousness , anxiety and chest pain. States it has been going on for about a week and states tonight he is very tense. Had his daughter drive him over.

## 2019-09-25 NOTE — ED Notes (Signed)
Report to amber, rn.  

## 2019-09-25 NOTE — ED Notes (Signed)
Pt states is feeling less anxious.

## 2019-09-25 NOTE — ED Provider Notes (Signed)
[facility] Emergency Department Provider Note   ____________________________________________   First MD Initiated Contact with Patient 09/25/19 [phone]     (approximate)  I have reviewed the triage vital signs and the nursing notes.   HISTORY  Chief Complaint Anxiety and Chest Pain    HPI Stanley Sanna. is a 79 y.o. male who presents to the ED from home with a chief complaint of anxiety and chest pain.  Patient has a history of anxiety, hypertension, hyperlipidemia.  Daughter states he cycles approximately every 6 months and thinks he has PTSD.  Patient states he has been generally anxious for the past several weeks.  For the last week his chest has been feeling tight.  Denies associated diaphoresis, shortness of breath, nausea/vomiting, palpitations or dizziness.  Denies SI/HI/AH/VH.  Daughter states he has been evaluated at the [facility] for this and supposed to be set up with a therapist but did not.  Patient reports good response with taking Lorazepam in the past.       Past Medical History:  Diagnosis Date  . Abnormal echocardiogram 2016   Dr. Juliann Pares  . Abscess after procedure   . Anxiety   . DDD (degenerative disc disease), lumbar 09/20/2013  . Hyperlipidemia, unspecified 09/20/2013  . Hypertension     Patient Active Problem List   Diagnosis Date Noted  . Anxiety   . Abnormal echocardiogram 04/27/2014  . DDD (degenerative disc disease), lumbar 09/20/2013  . HTN (hypertension) 09/20/2013  . Hyperlipidemia, unspecified 09/20/2013    Past Surgical History:  Procedure Laterality Date  . HERNIA REPAIR  2008   Dr. Excell Seltzer    Prior to Admission medications   Medication Sig Start Date End Date Taking? Authorizing Provider  acetaminophen (TYLENOL) 325 MG tablet Take 650 mg by mouth every 6 (six) hours as needed for mild pain.    [provider]  clonazePAM (KLONOPIN) 1 MG tablet Take 1 mg by mouth 2 (two) times daily as needed for anxiety.     [provider]  cyclobenzaprine (FLEXERIL) 5 MG tablet Take 1 tablet (5 mg total) by mouth 3 (three) times daily as needed (Take 0.5 to 1 tab TID prn spasms). 03/19/19   Menshew, Charlesetta Ivory, PA-C  diclofenac Sodium (VOLTAREN) 1 % GEL Apply 2-4 g topically 4 (four) times daily.    [provider]  fluticasone (FLONASE) 50 MCG/ACT nasal spray Place 1 spray into both nostrils daily.    [provider]  lisinopril (ZESTRIL) 40 MG tablet Take 20 mg by mouth daily.     [provider]  LORazepam (ATIVAN) 0.5 MG tablet Take 1 tablet (0.5 mg total) by mouth every 8 (eight) hours as needed for anxiety. 09/25/19   Irean Hong, MD  pregabalin (LYRICA) 150 MG capsule Take 150 mg by mouth 2 (two) times daily.    [provider]  sildenafil (VIAGRA) 100 MG tablet Take 100 mg by mouth daily as needed for erectile dysfunction.    [provider]  sodium chloride (OCEAN) 0.65 % nasal spray Place 1 spray into the nose as needed for congestion.    [provider]    Allergies Patient has no known allergies.  Family History  Problem Relation Age of Onset  . Cancer Mother   . Liver disease Father     Social History Social History   Tobacco Use  . Smoking status: Never Smoker  . Smokeless tobacco: Never Used  Substance Use Topics  .  Alcohol use: Yes  . Drug use: No    Review of Systems  Constitutional: No fever/chills Eyes: No visual changes. ENT: No sore throat. Cardiovascular: Positive for chest tightness. Respiratory: Denies shortness of breath. Gastrointestinal: No abdominal pain.  No nausea, no vomiting.  No diarrhea.  No constipation. Genitourinary: Negative for dysuria. Musculoskeletal: Negative for back pain. Skin: Negative for rash. Neurological: Negative for headaches, focal weakness or numbness. Psychiatric:  Positive for anxiety.  ____________________________________________   PHYSICAL EXAM:  VITAL  SIGNS: ED Triage Vitals  Enc Vitals Group     BP 09/25/19 0227 (!) 164/85     Pulse Rate 09/25/19 0227 78     Resp 09/25/19 0227 20     Temp 09/25/19 0227 98.2 F (36.8 C)     Temp Source 09/25/19 0227 Oral     SpO2 09/25/19 0227 94 %     Weight 09/25/19 0227 185 lb (83.9 kg)     Height 09/25/19 0227 5\' 5"  (1.651 m)     Head Circumference --      Peak Flow --      Pain Score 09/25/19 0234 6     Pain Loc --      Pain Edu? --      Excl. in [place]? --     Constitutional: Alert and oriented. Well appearing and in no acute distress. Eyes: Conjunctivae are normal. PERRL. EOMI. Head: Atraumatic. Nose: No congestion/rhinnorhea. Mouth/Throat: Mucous membranes are moist.   Neck: No stridor.  No thyromegaly. Cardiovascular: Normal rate, regular rhythm. Grossly normal heart sounds.  Good peripheral circulation. Respiratory: Normal respiratory effort.  No retractions. Lungs CTAB. Gastrointestinal: Soft and nontender to light or deep palpation. No distention. No abdominal bruits. No CVA tenderness. Musculoskeletal: No lower extremity tenderness nor edema.  No joint effusions. Neurologic:  Normal speech and language. No gross focal neurologic deficits are appreciated. No gait instability. Skin:  Skin is warm, dry and intact. No rash noted. Psychiatric: Mood and affect are mildly anxious.  Speech and behavior are normal.  ____________________________________________   LABS (all labs ordered are listed, but only abnormal results are displayed)  Labs Reviewed  BASIC METABOLIC PANEL - Abnormal; Notable for the following components:      Result Value   Glucose, Bld 116 (*)    All other components within normal limits  CBC  TSH  T4, FREE  TROPONIN I (HIGH SENSITIVITY)  TROPONIN I (HIGH SENSITIVITY)   ____________________________________________  EKG  ED ECG REPORT I, Malak Duchesneau J, the attending physician, personally viewed and interpreted this ECG.   Date: 09/25/2019  EKG Time: 0232   Rate: 75  Rhythm: normal EKG, normal sinus rhythm  Axis: Normal  Intervals:none  ST&T Change: Nonspecific  ____________________________________________  RADIOLOGY  ED MD interpretation: No acute cardiopulmonary process  Official radiology report(s): DG Chest 2 View  Result Date: 09/25/2019 CLINICAL DATA:  Right-sided chest pain EXAM: CHEST - 2 VIEW COMPARISON:  June 20, 2016 FINDINGS: The heart size and mediastinal contours are within normal limits. Subsegmental atelectasis seen at the right lung base. There is stable elevation of the right hemidiaphragm. The left lung is clear. No acute osseous abnormality. IMPRESSION: No active cardiopulmonary disease. Electronically Signed   By: Prudencio Pair M.D.   On: 09/25/2019 03:16    ____________________________________________   PROCEDURES  Procedure(s) performed (including Critical Care):  .1-3 Lead EKG Interpretation Performed by: Paulette Blanch, MD Authorized by: Paulette Blanch, MD     Interpretation: normal  ECG rate:  75   ECG rate assessment: normal     Rhythm: sinus rhythm     Ectopy: none     Conduction: normal   Comments:     Patient placed on cardiac monitor to evaluate for arrhythmias     ____________________________________________   INITIAL IMPRESSION / ASSESSMENT AND PLAN / ED COURSE  As part of my medical decision making, I reviewed the following data within the electronic MEDICAL RECORD NUMBER History obtained from family, Nursing notes reviewed and incorporated, Labs reviewed, EKG interpreted, Old chart reviewed, Radiograph reviewed and Notes from prior ED visits     Stanley Cosby. was evaluated in Emergency Department on 09/25/2019 for the symptoms described in the history of present illness. He was evaluated in the context of the global COVID-19 pandemic, which necessitated consideration that the patient might be at risk for infection with the SARS-CoV-2 virus that causes COVID-19. Institutional protocols  and algorithms that pertain to the evaluation of patients at risk for COVID-19 are in a state of rapid change based on information released by regulatory bodies including the CDC and federal and state organizations. These policies and algorithms were followed during the patient's care in the ED.    79 year old male presenting with anxiety and chest pain. Differential diagnosis includes, but is not limited to, ACS, aortic dissection, pulmonary embolism, cardiac tamponade, pneumothorax, pneumonia, pericarditis, myocarditis, GI-related causes including esophagitis/gastritis, and musculoskeletal chest wall pain.    Initial EKG and troponin unremarkable.  Will repeat troponin, check thyroid panel.  Administer low-dose Ativan.   Clinical Course as of Sep 24 698  Mon Sep 25, 2019  1610 Resting in no acute distress.  Feeling better.  Repeat troponin and thyroid panel unremarkable.  Will discharge home with prescription for low-dose Ativan and referral to [facility] for follow-up.  Strict return precautions given.  Patient and daughter verbalized understanding agree with plan of care.   [JS]    Clinical Course User Index [JS] Irean Hong, MD     ____________________________________________   FINAL CLINICAL IMPRESSION(S) / ED DIAGNOSES  Final diagnoses:  Chest pain, unspecified type  Anxiety     ED Discharge Orders         Ordered    LORazepam (ATIVAN) 0.5 MG tablet  Every 8 hours PRN     09/25/19 0656           Note:  This document was prepared using Dragon voice recognition software and may include unintentional dictation errors.   Irean Hong, MD 09/25/19 [phone]

## 2019-11-22 ENCOUNTER — Telehealth: Payer: Self-pay

## 2019-11-22 NOTE — Telephone Encounter (Signed)
LM for patient  to complete the new patient packet and to call us for any questions or concerns.

## 2019-11-23 ENCOUNTER — Ambulatory Visit
Payer: No Typology Code available for payment source | Attending: Student in an Organized Health Care Education/Training Program | Admitting: Student in an Organized Health Care Education/Training Program

## 2019-11-23 ENCOUNTER — Encounter: Payer: Self-pay | Admitting: Student in an Organized Health Care Education/Training Program

## 2019-11-23 ENCOUNTER — Other Ambulatory Visit: Payer: Self-pay

## 2019-11-23 VITALS — BP 132/79 | HR 91 | Temp 97.2°F | Resp 16 | Ht 64.0 in | Wt 178.0 lb

## 2019-11-23 DIAGNOSIS — G8929 Other chronic pain: Secondary | ICD-10-CM | POA: Diagnosis present

## 2019-11-23 DIAGNOSIS — M48061 Spinal stenosis, lumbar region without neurogenic claudication: Secondary | ICD-10-CM | POA: Insufficient documentation

## 2019-11-23 DIAGNOSIS — M48062 Spinal stenosis, lumbar region with neurogenic claudication: Secondary | ICD-10-CM | POA: Insufficient documentation

## 2019-11-23 DIAGNOSIS — M5416 Radiculopathy, lumbar region: Secondary | ICD-10-CM | POA: Insufficient documentation

## 2019-11-23 DIAGNOSIS — M47816 Spondylosis without myelopathy or radiculopathy, lumbar region: Secondary | ICD-10-CM | POA: Diagnosis not present

## 2019-11-23 DIAGNOSIS — G894 Chronic pain syndrome: Secondary | ICD-10-CM | POA: Insufficient documentation

## 2019-11-23 DIAGNOSIS — M5136 Other intervertebral disc degeneration, lumbar region: Secondary | ICD-10-CM | POA: Insufficient documentation

## 2019-11-23 NOTE — Progress Notes (Signed)
Safety precautions to be maintained throughout the outpatient stay will include: orient to surroundings, keep bed in low position, maintain call bell within reach at all times, provide assistance with transfer out of bed and ambulation.  

## 2019-11-23 NOTE — Patient Instructions (Signed)

## 2019-11-23 NOTE — Progress Notes (Signed)
Patient: Stanley Baxter.  Service Category: E/M  Provider: Gillis Santa, MD  DOB: [birth year]  DOS: 11/23/2019  Referring Provider: [facility]  MRN: [id number]  Setting: Ambulatory outpatient  PCP: [place]  Type: New Patient  Specialty: Interventional Pain Management    Location: Office  Delivery: Face-to-face     Primary Reason(s) for Visit: Encounter for initial evaluation of one or more chronic problems (new to examiner) potentially causing chronic pain, and posing a threat to normal musculoskeletal function. (Level of risk: High) CC: Back Pain (lower)  HPI  Stanley Baxter is a 79 y.o. year old, male patient, who comes today to see [place] for the first time for an initial evaluation of his chronic pain. He has Lumbar degenerative disc disease; HTN (hypertension); Hyperlipidemia, unspecified; Anxiety; Abnormal echocardiogram; Lumbar radiculopathy; Spinal stenosis, lumbar region, with neurogenic claudication; Lumbar facet arthropathy; and Chronic pain syndrome on their problem list. Today he comes in for evaluation of his Back Pain (lower)  Pain Assessment: Location: Lower Back Radiating: both legs to the feet Onset: More than a month ago Duration: Chronic pain Quality: Nagging Severity: 8 /10 (subjective, self-reported pain score)  Note: Reported level is compatible with observation.  Effect on ADL: difficulty performing daily activities Timing: Constant Modifying factors: medications BP: (!) 132/79   HR: 91  Onset and Duration: Sudden, Gradual and Present longer than 3 months Cause of pain: Unknown Severity: No change since onset, NAS-11 at its worse: 10/10, NAS-11 at its best: 4/10, NAS-11 now: 8/10 and NAS-11 on the average: 7/10 Timing: Not influenced by the time of the day and After activity or exercise Aggravating Factors: Bending, Climbing, Kneeling, Lifiting, Motion, Nerve blocks, Prolonged sitting, Prolonged standing, Squatting, Stooping , Twisting, Walking,  Walking uphill, Walking downhill and Working Alleviating Factors: Stretching, Medications, Resting and Warm showers or baths Associated Problems: Constipation, Day-time cramps, Night-time cramps, Dizziness, Fatigue, Inability to concentrate, Inability to control bladder (urine), Inability to control bowel, Numbness, Personality changes, Spasms, Sweating, Tingling, Weakness, Pain that wakes patient up and Pain that does not allow patient to sleep Quality of Pain: Aching, Annoying, Burning, Constant, Cramping, Deep, Exhausting, Getting longer, Hot, Nagging, Pulsating, Sharp, Shooting, Tender, Throbbing, Tingling and Toothache-like Previous Examinations or Tests: Bone scan and CT scan Previous Treatments: Stretching exercises  The patient comes into the clinics today for the first time for a chronic pain management evaluation.   Stanley Baxter is a very pleasant 79 year old male who presents with a chief complaint of low back pain that radiates into his bilateral legs down to his feet in a dermatomal fashion.  This has been going on for many years.  Makes it difficult for him to walk.  Patient is currently on Cymbalta and Lyrica which he states does help with his symptoms somewhat.  He denies having done any injections in the past.  He has performed physical therapy in the past and also does home stretching exercises to help with range of motion but this has not helped with his radiating low back pain that goes into his bilateral feet.  Patient has had a CT of his lumbar spine, results of which are below which show lumbar spinal stenosis and nerve compression resulting in lumbar radicular pain.   Meds   Current Outpatient Medications:    acetaminophen (TYLENOL) 325 MG tablet, Take 650 mg by mouth every 6 (six) hours as needed for mild pain., Disp: , Rfl:    amLODipine (NORVASC) 10 MG tablet, Take 10  mg by mouth daily. 1/2 tab daily, Disp: , Rfl:    BUDESONIDE IN, Inhale 1 spray into the lungs. Each  nostril daily, Disp: , Rfl:    cetirizine (ZYRTEC) 10 MG tablet, Take 10 mg by mouth daily., Disp: , Rfl:    Cholecalciferol 25 MCG (1000 UT) tablet, Take 1,000 Units by mouth daily., Disp: , Rfl:    diclofenac Sodium (VOLTAREN) 1 % GEL, Apply 2-4 g topically 4 (four) times daily., Disp: , Rfl:    DULoxetine (CYMBALTA) 30 MG capsule, Take 30 mg by mouth daily., Disp: , Rfl:    Melatonin-Pyridoxine (MELATIN PO), Take 3 mg by mouth at bedtime., Disp: , Rfl:    prazosin (MINIPRESS) 1 MG capsule, Take 1 mg by mouth at bedtime., Disp: , Rfl:    pregabalin (LYRICA) 150 MG capsule, Take 150 mg by mouth 2 (two) times daily., Disp: , Rfl:    sildenafil (VIAGRA) 100 MG tablet, Take 100 mg by mouth daily as needed for erectile dysfunction., Disp: , Rfl:    tamsulosin (FLOMAX) 0.4 MG CAPS capsule, Take 0.4 mg by mouth., Disp: , Rfl:   Imaging Review    CT Lumbar Spine Wo Contrast  Narrative CLINICAL DATA:  Right low back pain  EXAM: CT LUMBAR SPINE WITHOUT CONTRAST  TECHNIQUE: Multidetector CT imaging of the lumbar spine was performed without intravenous contrast administration. Multiplanar CT image reconstructions were also generated.  COMPARISON:  X-ray 03/03/2018  FINDINGS: Segmentation: 5 lumbar type vertebrae.  Alignment: 3 mm retrolisthesis L2 on L3. No evidence of traumatic listhesis. Facet joint alignment is maintained.  Vertebrae: Vertebral body heights are maintained without acute fracture. No bone lesion.  Paraspinal and other soft tissues: 5 mm rounded metallic foreign body within the subcutaneous soft tissues overlying the medial aspect of the left gluteal musculature (series 6, image 130). Mild volume of stool throughout the visualized:Marland Kitchen Mild diffuse urinary bladder wall thickening.  Disc levels:  T12-L1: No large disc protrusion. No evidence of foraminal or canal stenosis.  L1-L2: No large disc protrusion. No evidence of foraminal or  canal stenosis.  L2-L3: Slight retrolisthesis with associated diffuse disc bulge. Mild bilateral facet arthrosis and buckling of the ligamentum flavum contributes to moderate canal stenosis and moderate bilateral foraminal stenosis.  L3-L4: Diffuse disc bulge with mild bilateral facet arthrosis and buckling of the ligamentum flavum contribute to moderate canal stenosis and mild bilateral foraminal stenosis.  L4-L5: Diffuse disc bulge with mild bilateral facet arthrosis and ligamentum flavum buckling result in borderline/mild canal stenosis and mild bilateral foraminal stenosis.  L5-S1: Mild diffuse disc bulge and mild bilateral facet arthrosis without evidence of foraminal or canal stenosis.  IMPRESSION: 1. 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 Findings are most pronounced at L2-3 where there it appears to be moderate canal stenosis with moderate bilateral foraminal stenosis. There is also moderate canal stenosis at the L3-4 level. 2. Mild diffuse urinary bladder wall thickening. Correlate for cystitis. 3. 5 mm rounded metallic foreign body within the subcutaneous soft tissues overlying the medial aspect of the left gluteal musculature.   Electronically Signed By: Davina Poke M.D. On: 03/19/2019 12:20    DG Lumbar Spine Complete  Narrative CLINICAL DATA:  Low back pain.  Radiates into the buttock and leg  EXAM: LUMBAR SPINE - COMPLETE 4+ VIEW  COMPARISON:  CT abdomen 06/19/2016  FINDINGS: There is no evidence of lumbar spine fracture. 2 mm retrolisthesis of L3 on L4. Degenerative disc disease mild  disc height loss at L1-2, L2-3, L4-5 and L5-S1. Bilateral facet arthropathy at L4-5 and L5-S1.  Abdominal aortic atherosclerosis.  IMPRESSION: Mild lumbar spine spondylosis.   Electronically Signed By: Kathreen Devoid On: 03/03/2018 13:57   Complexity Note: Imaging results reviewed. Results shared with Mr.  Puebla, using Layman's terms.                         ROS  Cardiovascular: High blood pressure Pulmonary or Respiratory: No reported pulmonary signs or symptoms such as wheezing and difficulty taking a deep full breath (Asthma), difficulty blowing air out (Emphysema), coughing up mucus (Bronchitis), persistent dry cough, or temporary stoppage of breathing during sleep Neurological: No reported neurological signs or symptoms such as seizures, abnormal skin sensations, urinary and/or fecal incontinence, being born with an abnormal open spine and/or a tethered spinal cord Psychological-Psychiatric: Anxiousness Gastrointestinal: No reported gastrointestinal signs or symptoms such as vomiting or evacuating blood, reflux, heartburn, alternating episodes of diarrhea and constipation, inflamed or scarred liver, or pancreas or irrregular and/or infrequent bowel movements Genitourinary: No reported renal or genitourinary signs or symptoms such as difficulty voiding or producing urine, peeing blood, non-functioning kidney, kidney stones, difficulty emptying the bladder, difficulty controlling the flow of urine, or chronic kidney disease Hematological: No reported hematological signs or symptoms such as prolonged bleeding, low or poor functioning platelets, bruising or bleeding easily, hereditary bleeding problems, low energy levels due to low hemoglobin or being anemic Endocrine: No reported endocrine signs or symptoms such as high or low blood sugar, rapid heart rate due to high thyroid levels, obesity or weight gain due to slow thyroid or thyroid disease Rheumatologic: No reported rheumatological signs and symptoms such as fatigue, joint pain, tenderness, swelling, redness, heat, stiffness, decreased range of motion, with or without associated rash Musculoskeletal: Negative for myasthenia gravis, muscular dystrophy, multiple sclerosis or malignant hyperthermia Work History: Retired  Allergies  Mr. Davoli  has No Known Allergies.  Laboratory Chemistry Profile   Renal Lab Results  Component Value Date   BUN 15 09/25/2019   CREATININE 0.96 09/25/2019   GFRAA >60 09/25/2019   GFRNONAA >60 09/25/2019   PROTEINUR NEGATIVE 03/19/2019     Electrolytes Lab Results  Component Value Date   NA 139 09/25/2019   K 3.9 09/25/2019   CL 103 09/25/2019   CALCIUM 9.0 09/25/2019     Hepatic Lab Results  Component Value Date   AST 38 06/20/2016   ALT 27 06/20/2016   ALBUMIN 4.2 06/20/2016   ALKPHOS 69 06/20/2016   LIPASE 33 06/19/2016     ID Lab Results  Component Value Date   SARSCOV2NAA Not Detected 03/22/2019     Bone No results found for: VD25OH, PP509TO6ZTI, WP8099IP3, AS5053ZJ6, 25OHVITD1, 25OHVITD2, 25OHVITD3, TESTOFREE, TESTOSTERONE   Endocrine Lab Results  Component Value Date   GLUCOSE 116 (H) 09/25/2019   GLUCOSEU NEGATIVE 03/19/2019   TSH 1.276 09/25/2019   FREET4 0.87 09/25/2019     Neuropathy No results found for: VITAMINB12, FOLATE, HGBA1C, HIV   CNS No results found for: COLORCSF, APPEARCSF, RBCCOUNTCSF, WBCCSF, POLYSCSF, LYMPHSCSF, EOSCSF, PROTEINCSF, GLUCCSF, JCVIRUS, CSFOLI, IGGCSF, LABACHR, ACETBL, LABACHR, ACETBL   Inflammation (CRP: Acute   ESR: Chronic) Lab Results  Component Value Date   LATICACIDVEN 1.0 06/20/2016     Rheumatology No results found for: RF, ANA, LABURIC, URICUR, LYMEIGGIGMAB, LYMEABIGMQN, HLAB27   Coagulation Lab Results  Component Value Date   PLT 326 09/25/2019     Cardiovascular  Lab Results  Component Value Date   BNP 21.0 06/20/2016   CKTOTAL 114 01/12/2017   TROPONINI <0.03 06/20/2016   HGB 13.5 09/25/2019   HCT 41.9 09/25/2019     Screening Lab Results  Component Value Date   SARSCOV2NAA Not Detected 03/22/2019     Cancer No results found for: CEA, CA125, LABCA2   Allergens No results found for: ALMOND, APPLE, ASPARAGUS, AVOCADO, BANANA, BARLEY, BASIL, BAYLEAF, GREENBEAN, LIMABEAN, WHITEBEAN, BEEFIGE,  REDBEET, BLUEBERRY, BROCCOLI, CABBAGE, MELON, CARROT, CASEIN, CASHEWNUT, CAULIFLOWER, CELERY     Note: Lab results reviewed.   [place]  Drug: Mr. Pollio  reports no history of drug use. Alcohol:  reports previous alcohol use. Tobacco:  reports that he has never smoked. He has never used smokeless tobacco. Medical:  has a past medical history of Abnormal echocardiogram (2016), Abscess after procedure, Anxiety, DDD (degenerative disc disease), lumbar (09/20/2013), Hyperlipidemia, unspecified (09/20/2013), and Hypertension. Family: family history includes Cancer in his mother; Liver disease in his father.  Past Surgical History:  Procedure Laterality Date   HERNIA REPAIR  2008   Dr. Burt Knack   Active Ambulatory Problems    Diagnosis Date Noted   Lumbar degenerative disc disease 09/20/2013   HTN (hypertension) 09/20/2013   Hyperlipidemia, unspecified 09/20/2013   Anxiety    Abnormal echocardiogram 04/27/2014   Lumbar radiculopathy 11/23/2019   Spinal stenosis, lumbar region, with neurogenic claudication 11/23/2019   Lumbar facet arthropathy 11/23/2019   Chronic pain syndrome 11/23/2019   Resolved Ambulatory Problems    Diagnosis Date Noted   No Resolved Ambulatory Problems   Past Medical History:  Diagnosis Date   Abscess after procedure    DDD (degenerative disc disease), lumbar 09/20/2013   Hypertension    Constitutional Exam  General appearance: Well nourished, well developed, and well hydrated. In no apparent acute distress Vitals:   11/23/19 1111  BP: (!) 132/79  Pulse: 91  Resp: 16  Temp: (!) 97.2 F (36.2 C)  TempSrc: Temporal  SpO2: 98%  Weight: 178 lb (80.7 kg)  Height: _0  (1.626 m)   BMI Assessment: Estimated body mass index is 30.55 kg/m as calculated from the following:   Height as of this encounter: _1  (1.626 m).   Weight as of this encounter: 178 lb (80.7 kg).  BMI interpretation table: BMI level Category Range association with higher  incidence of chronic pain  <18 kg/m2 Underweight   18.5-24.9 kg/m2 Ideal body weight   25-29.9 kg/m2 Overweight Increased incidence by 20%  30-34.9 kg/m2 Obese (Class I) Increased incidence by 68%  35-39.9 kg/m2 Severe obesity (Class II) Increased incidence by 136%  >40 kg/m2 Extreme obesity (Class III) Increased incidence by 254%   Patient's current BMI Ideal Body weight  Body mass index is 30.55 kg/m. Ideal body weight: 59.2 kg (130 lb 8.2 oz) Adjusted ideal body weight: 67.8 kg (149 lb 8.1 oz)   BMI Readings from Last 4 Encounters:  11/23/19 30.55 kg/m  09/25/19 30.79 kg/m  03/19/19 28.32 kg/m  03/09/19 29.95 kg/m   Wt Readings from Last 4 Encounters:  11/23/19 178 lb (80.7 kg)  09/25/19 185 lb (83.9 kg)  03/19/19 165 lb (74.8 kg)  03/09/19 180 lb (81.6 kg)    Psych/Mental status: Alert, oriented x 3 (person, place, & time)       Eyes: PERLA Respiratory: No evidence of acute respiratory distress  Cervical Spine Exam  Skin & Axial Inspection: No masses, redness, edema, swelling, or associated skin lesions Alignment: Symmetrical Functional  ROM: Unrestricted ROM      Stability: No instability detected Muscle Tone/Strength: Functionally intact. No obvious neuro-muscular anomalies detected. Sensory (Neurological): Unimpaired Palpation: No palpable anomalies              Upper Extremity (UE) Exam    Side: Right upper extremity  Side: Left upper extremity  Skin & Extremity Inspection: Skin color, temperature, and hair growth are WNL. No peripheral edema or cyanosis. No masses, redness, swelling, asymmetry, or associated skin lesions. No contractures.  Skin & Extremity Inspection: Skin color, temperature, and hair growth are WNL. No peripheral edema or cyanosis. No masses, redness, swelling, asymmetry, or associated skin lesions. No contractures.  Functional ROM: Unrestricted ROM          Functional ROM: Unrestricted ROM          Muscle Tone/Strength: Functionally intact. No  obvious neuro-muscular anomalies detected.   Muscle Tone/Strength: Functionally intact. No obvious neuro-muscular anomalies detected.  Sensory (Neurological): Unimpaired          Sensory (Neurological): Unimpaired          Palpation: No palpable anomalies              Palpation: No palpable anomalies              Provocative Test(s):  Phalen's test: deferred Tinel's test: deferred Apley's scratch test (touch opposite shoulder):  Action 1 (Across chest): deferred Action 2 (Overhead): deferred Action 3 (LB reach): deferred   Provocative Test(s):  Phalen's test: deferred Tinel's test: deferred Apley's scratch test (touch opposite shoulder):  Action 1 (Across chest): deferred Action 2 (Overhead): deferred Action 3 (LB reach): deferred    Thoracic Spine Area Exam  Skin & Axial Inspection: No masses, redness, or swelling Alignment: Symmetrical Functional ROM: Unrestricted ROM Stability: No instability detected Muscle Tone/Strength: Functionally intact. No obvious neuro-muscular anomalies detected. Sensory (Neurological): Unimpaired Muscle strength & Tone: No palpable anomalies  Lumbar Exam  Skin & Axial Inspection: No masses, redness, or swelling Alignment: Symmetrical Functional ROM: Unrestricted ROM       Stability: No instability detected Muscle Tone/Strength: Functionally intact. No obvious neuro-muscular anomalies detected. Sensory (Neurological): Dermatomal pain pattern and musculoskeletal Palpation: No palpable anomalies       Provocative Tests: Hyperextension/rotation test: (+) bilaterally for facet joint pain. Lumbar quadrant test (Kemp's test): (+) bilateral for foraminal stenosis Lateral bending test: (+) ipsilateral radicular pain, bilaterally. Positive for bilateral foraminal stenosis. Patrick's Maneuver: deferred today                   FABER* test: deferred today                   S-I anterior distraction/compression test: deferred today         S-I lateral  compression test: deferred today         S-I Thigh-thrust test: deferred today         S-I Gaenslen's test: deferred today         *(Flexion, ABduction and External Rotation)  Gait & Posture Assessment  Ambulation: Patient ambulates using a cane Gait: Limited. Using assistive device to ambulate Posture: Difficulty standing up straight, due to pain   Lower Extremity Exam    Side: Right lower extremity  Side: Left lower extremity  Stability: No instability observed          Stability: No instability observed          Skin & Extremity Inspection: Skin color, temperature,  and hair growth are WNL. No peripheral edema or cyanosis. No masses, redness, swelling, asymmetry, or associated skin lesions. No contractures.  Skin & Extremity Inspection: Skin color, temperature, and hair growth are WNL. No peripheral edema or cyanosis. No masses, redness, swelling, asymmetry, or associated skin lesions. No contractures.  Functional ROM: Pain restricted ROM for hip and knee joints           Functional ROM: Pain restricted ROM for hip and knee joints          Muscle Tone/Strength: Functionally intact. No obvious neuro-muscular anomalies detected.  Muscle Tone/Strength: Functionally intact. No obvious neuro-muscular anomalies detected.  Sensory (Neurological): Unimpaired        Sensory (Neurological): Unimpaired        DTR: Patellar: deferred today Achilles: deferred today Plantar: deferred today  DTR: Patellar: deferred today Achilles: deferred today Plantar: deferred today  Palpation: No palpable anomalies  Palpation: No palpable anomalies   Assessment  Primary Diagnosis & Pertinent Problem List: The primary encounter diagnosis was Chronic radicular lumbar pain. Diagnoses of Lumbar radiculopathy (L2/3 and L3/4), Lumbar degenerative disc disease, Spinal stenosis, lumbar region, with neurogenic claudication, Lumbar facet arthropathy, Lumbar spondylosis, Chronic pain syndrome, and Lumbar foraminal stenosis  were also pertinent to this visit.  Visit Diagnosis (New problems to examiner): 1. Chronic radicular lumbar pain   2. Lumbar radiculopathy (L2/3 and L3/4)   3. Lumbar degenerative disc disease   4. Spinal stenosis, lumbar region, with neurogenic claudication   5. Lumbar facet arthropathy   6. Lumbar spondylosis   7. Chronic pain syndrome   8. Lumbar foraminal stenosis    Plan of Care (Initial workup plan)   Mr. Reggio is a 79 year old male with history of low back pain with radiation to bilateral lower extremities secondary to lumbar degenerative disc disease, lumbar radiculopathy, lumbar neuroforaminal stenosis affecting his L2-L3 and L3-L4 region.  Patient is currently on Cymbalta and Lyrica which does help to a certain extent.  He does home stretching exercises to help with his range of motion.  He has not tried lumbar epidural steroid injection in the past.  We discussed risks and benefits of this procedure and patient would like to trial.  He denies having been on any steroid medications in the last 30 days, denies being on any blood thinners.   Procedure Orders     Lumbar Epidural Injection   Pharmacological management options:  Opioid Analgesics: The patient was informed that there is no guarantee that he would be a candidate for opioid analgesics. The decision will be made following CDC guidelines. This decision will be based on the results of diagnostic studies, as well as Mr. Mori's risk profile.   Membrane stabilizer: Continue Lyrica 150 mg twice daily Cymbalta 30 mg daily.  Muscle relaxant: To be determined at a later time  NSAID: Do not recommend at his age and given his risk factors of cardiovascular disease  Other analgesic(s): To be determined at a later time   Interventional management options: Mr. Speros was informed that there is no guarantee that he would be a candidate for interventional therapies. The decision will be based on the results of diagnostic studies,  as well as Mr. Haviland's risk profile.  Procedure(s) under consideration:  Lumbar epidural steroid injection Lumbar facet medial branch nerve block Sprint peripheral nerve stimulation SI joint injection   Provider-requested follow-up: Return in about 4 days (around 11/27/2019) for L2/3-ESI w/o sedation.  Future Appointments  Date Time Provider [place]  11/27/2019 10:30 AM Gillis Santa, MD [facility] None    Note by: Gillis Santa, MD Date: 11/23/2019; Time: 1:25 PM

## 2019-11-27 ENCOUNTER — Ambulatory Visit
Admission: RE | Admit: 2019-11-27 | Discharge: 2019-11-27 | Disposition: A | Discharge status: Home / Self Care | Payer: No Typology Code available for payment source | Source: Ambulatory Visit | Attending: Student in an Organized Health Care Education/Training Program | Admitting: Student in an Organized Health Care Education/Training Program

## 2019-11-27 ENCOUNTER — Ambulatory Visit (HOSPITAL_BASED_OUTPATIENT_CLINIC_OR_DEPARTMENT_OTHER)
Payer: No Typology Code available for payment source | Admitting: Student in an Organized Health Care Education/Training Program

## 2019-11-27 ENCOUNTER — Encounter: Payer: Self-pay | Admitting: Student in an Organized Health Care Education/Training Program

## 2019-11-27 ENCOUNTER — Other Ambulatory Visit: Payer: Self-pay

## 2019-11-27 ENCOUNTER — Ambulatory Visit
Payer: No Typology Code available for payment source | Admitting: Student in an Organized Health Care Education/Training Program

## 2019-11-27 VITALS — BP 137/67 | HR 84 | Temp 98.4°F | Resp 22 | Ht 64.0 in | Wt 178.0 lb

## 2019-11-27 DIAGNOSIS — G8929 Other chronic pain: Secondary | ICD-10-CM | POA: Insufficient documentation

## 2019-11-27 DIAGNOSIS — M48061 Spinal stenosis, lumbar region without neurogenic claudication: Secondary | ICD-10-CM | POA: Insufficient documentation

## 2019-11-27 DIAGNOSIS — G894 Chronic pain syndrome: Secondary | ICD-10-CM

## 2019-11-27 DIAGNOSIS — M48062 Spinal stenosis, lumbar region with neurogenic claudication: Secondary | ICD-10-CM | POA: Insufficient documentation

## 2019-11-27 DIAGNOSIS — M5416 Radiculopathy, lumbar region: Secondary | ICD-10-CM | POA: Diagnosis not present

## 2019-11-27 MED ORDER — ROPIVACAINE HCL 2 MG/ML IJ SOLN
2.0000 mL | Freq: Once | INTRAMUSCULAR | Status: AC
  Administered 2019-11-27: 2 mL via EPIDURAL
  Filled 2019-11-27: qty 10

## 2019-11-27 MED ORDER — LIDOCAINE HCL 2 % IJ SOLN
20.0000 mL | Freq: Once | INTRAMUSCULAR | Status: AC
  Administered 2019-11-27: 400 mg

## 2019-11-27 MED ORDER — IOHEXOL 180 MG/ML  SOLN
10.0000 mL | Freq: Once | INTRAMUSCULAR | Status: AC
  Administered 2019-11-27: 10 mL via EPIDURAL

## 2019-11-27 MED ORDER — DEXAMETHASONE SODIUM PHOSPHATE 10 MG/ML IJ SOLN
10.0000 mg | Freq: Once | INTRAMUSCULAR | Status: AC
  Administered 2019-11-27: 10 mg
  Filled 2019-11-27: qty 1

## 2019-11-27 MED ORDER — ROPIVACAINE HCL 2 MG/ML IJ SOLN
INTRAMUSCULAR | Status: AC
  Filled 2019-11-27: qty 10

## 2019-11-27 MED ORDER — SODIUM CHLORIDE 0.9% FLUSH
2.0000 mL | Freq: Once | INTRAVENOUS | Status: AC
  Administered 2019-11-27: 2 mL

## 2019-11-27 NOTE — Patient Instructions (Signed)
Pain Management Discharge Instructions  General Discharge Instructions :  If you need to reach your doctor call: Monday-Friday 8:00 am - 4:00 pm at [phone] or toll free [phone].  After clinic hours [phone] to have operator reach doctor.  Bring all of your medication bottles to all your appointments in the pain clinic.  To cancel or reschedule your appointment with Pain Management please remember to call 24 hours in advance to avoid a fee.  Refer to the educational materials which you have been given on: General Risks, I had my Procedure. Discharge Instructions, Post Sedation.  Post Procedure Instructions:  The drugs you were given will stay in your system until tomorrow, so for the next 24 hours you should not drive, make any legal decisions or drink any alcoholic beverages.  You may eat anything you prefer, but it is better to start with liquids then soups and crackers, and gradually work up to solid foods.  Please notify your doctor immediately if you have any unusual bleeding, trouble breathing or pain that is not related to your normal pain.  Depending on the type of procedure that was done, some parts of your body may feel week and/or numb.  This usually clears up by tonight or the next day.  Walk with the use of an assistive device or accompanied by an adult for the 24 hours.  You may use ice on the affected area for the first 24 hours.  Put ice in a Ziploc bag and cover with a towel and place against area 15 minutes on 15 minutes off.  You may switch to heat after 24 hours.Epidural Steroid Injection Patient Information  Description: The epidural space surrounds the nerves as they exit the spinal cord.  In some patients, the nerves can be compressed and inflamed by a bulging disc or a tight spinal canal (spinal stenosis).  By injecting steroids into the epidural space, we can bring irritated nerves into direct contact with a potentially helpful medication.  These  steroids act directly on the irritated nerves and can reduce swelling and inflammation which often leads to decreased pain.  Epidural steroids may be injected anywhere along the spine and from the neck to the low back depending upon the location of your pain.   After numbing the skin with local anesthetic (like Novocaine), a small needle is passed into the epidural space slowly.  You may experience a sensation of pressure while this is being done.  The entire block usually last less than 10 minutes.  Conditions which may be treated by epidural steroids:   Low back and leg pain  Neck and arm pain  Spinal stenosis  Post-laminectomy syndrome  Herpes zoster (shingles) pain  Pain from compression fractures  Preparation for the injection:  1. Do not eat any solid food or dairy products within 8 hours of your appointment.  2. You may drink clear liquids up to 3 hours before appointment.  Clear liquids include water, black coffee, juice or soda.  No milk or cream please. 3. You may take your regular medication, including pain medications, with a sip of water before your appointment  Diabetics should hold regular insulin (if taken separately) and take 1/2 normal NPH dos the morning of the procedure.  Carry some sugar containing items with you to your appointment. 4. A driver must accompany you and be prepared to drive you home after your procedure.  5. Bring all your current medications with your. 6. An IV may be inserted and   sedation may be given at the discretion of the physician.   7. A blood pressure cuff, EKG and other monitors will often be applied during the procedure.  Some patients may need to have extra oxygen administered for a short period. 8. You will be asked to provide medical information, including your allergies, prior to the procedure.  We must know immediately if you are taking blood thinners (like Coumadin/Warfarin)  Or if you are allergic to IV iodine contrast (dye). We must  know if you could possible be pregnant.  Possible side-effects:  Bleeding from needle site  Infection (rare, may require surgery)  Nerve injury (rare)  Numbness & tingling (temporary)  Difficulty urinating (rare, temporary)  Spinal headache ( a headache worse with upright posture)  Light -headedness (temporary)  Pain at injection site (several days)  Decreased blood pressure (temporary)  Weakness in arm/leg (temporary)  Pressure sensation in back/neck (temporary)  Call if you experience:  Fever/chills associated with headache or increased back/neck pain.  Headache worsened by an upright position.  New onset weakness or numbness of an extremity below the injection site  Hives or difficulty breathing (go to the emergency room)  Inflammation or drainage at the infection site  Severe back/neck pain  Any new symptoms which are concerning to you  Please note:  Although the local anesthetic injected can often make your back or neck feel good for several hours after the injection, the pain will likely return.  It takes 3-7 days for steroids to work in the epidural space.  You may not notice any pain relief for at least that one week.  If effective, we will often do a series of three injections spaced 3-6 weeks apart to maximally decrease your pain.  After the initial series, we generally will wait several months before considering a repeat injection of the same type.  If you have any questions, please call [phone] [facility] 

## 2019-11-27 NOTE — Progress Notes (Signed)
Safety precautions to be maintained throughout the outpatient stay will include: orient to surroundings, keep bed in low position, maintain call bell within reach at all times, provide assistance with transfer out of bed and ambulation.  

## 2019-11-27 NOTE — Progress Notes (Signed)
PROVIDER NOTE: Information contained herein reflects review and annotations entered in association with encounter. Interpretation of such information and data should be left to medically-trained personnel. Information provided to patient can be located elsewhere in the medical record under "Patient Instructions". Document created using STT-dictation technology, any transcriptional errors that may result from process are unintentional.    Patient: Stanley Baxter Jr.  Service Category: Procedure  Provider: Edward JollyBilal Sisto Granillo, MD  DOB: 06-01-1940  DOS: 11/27/2019  Location: [facility]  MRN: [id number]  Setting: Ambulatory - outpatient  Referring Provider: [facility]  Type: Established Patient  Specialty: Interventional Pain Management  [facility]   Primary Reason for Visit: Interventional Pain Management Treatment. CC: Back Pain (low), Hip Pain (bilateral), and Leg Pain (bilateral)  Procedure:          Anesthesia, Analgesia, Anxiolysis:  Type: Diagnostic Inter-Laminar Epidural Steroid Injection  #1  Region: Lumbar Level: L2-3 Level. Laterality: Midline         Type: Local Anesthesia  Local Anesthetic: Lidocaine 1-2%  Position: Prone with head of the table was raised to facilitate breathing.   Indications: 1. Chronic radicular lumbar pain   2. Lumbar radiculopathy (L2/3 and L3/4)   3. Spinal stenosis, lumbar region, with neurogenic claudication   4. Lumbar foraminal stenosis   5. Chronic pain syndrome    Pain Score: Pre-procedure: 8 /10 Post-procedure:5/10   Pre-op Assessment:  Stanley Baxter is a 79 y.o. (year old), male patient, seen today for interventional treatment. He  has a past surgical history that includes Hernia repair (2008). Stanley Baxter has a current medication list which includes the following prescription(s): acetaminophen, amlodipine, budesonide, cetirizine, cholecalciferol, diclofenac sodium, duloxetine, melatonin-pyridoxine,  prazosin, pregabalin, sildenafil, and tamsulosin. His primarily concern today is the Back Pain (low), Hip Pain (bilateral), and Leg Pain (bilateral)  Initial Vital Signs:  Pulse/HCG Rate: 84ECG Heart Rate: 81 Temp: 98.4 F (36.9 C) Resp: 20 BP: 132/73 SpO2: 97 %  BMI: Estimated body mass index is 30.55 kg/m as calculated from the following:   Height as of this encounter: 5\' 4"  (1.626 m).   Weight as of this encounter: 178 lb (80.7 kg).  Risk Assessment: Allergies: Reviewed. He has No Known Allergies.  Allergy Precautions: None required Coagulopathies: Reviewed. None identified.  Blood-thinner therapy: None at this time Active Infection(s): Reviewed. None identified. Stanley Baxter is afebrile  Site Confirmation: Stanley Baxter was asked to confirm the procedure and laterality before marking the site Procedure checklist: Completed Consent: Before the procedure and under the influence of no sedative(s), amnesic(s), or anxiolytics, the patient was informed of the treatment options, risks and possible complications. To fulfill our ethical and legal obligations, as recommended by the American Medical Association's Code of Ethics, I have informed the patient of my clinical impression; the nature and purpose of the treatment or procedure; the risks, benefits, and possible complications of the intervention; the alternatives, including doing nothing; the risk(s) and benefit(s) of the alternative treatment(s) or procedure(s); and the risk(s) and benefit(s) of doing nothing. The patient was provided information about the general risks and possible complications associated with the procedure. These may include, but are not limited to: failure to achieve desired goals, infection, bleeding, organ or nerve damage, allergic reactions, paralysis, and death. In addition, the patient was informed of those risks and complications associated to Spine-related procedures, such as failure to decrease pain; infection  (i.e.: Meningitis, epidural or intraspinal abscess); bleeding (i.e.: epidural hematoma, subarachnoid hemorrhage, or  any other type of intraspinal or peri-dural bleeding); organ or nerve damage (i.e.: Any type of peripheral nerve, nerve root, or spinal cord injury) with subsequent damage to sensory, motor, and/or autonomic systems, resulting in permanent pain, numbness, and/or weakness of one or several areas of the body; allergic reactions; (i.e.: anaphylactic reaction); and/or death. Furthermore, the patient was informed of those risks and complications associated with the medications. These include, but are not limited to: allergic reactions (i.e.: anaphylactic or anaphylactoid reaction(s)); adrenal axis suppression; blood sugar elevation that in diabetics may result in ketoacidosis or comma; water retention that in patients with history of congestive heart failure may result in shortness of breath, pulmonary edema, and decompensation with resultant heart failure; weight gain; swelling or edema; medication-induced neural toxicity; particulate matter embolism and blood vessel occlusion with resultant organ, and/or nervous system infarction; and/or aseptic necrosis of one or more joints. Finally, the patient was informed that Medicine is not an exact science; therefore, there is also the possibility of unforeseen or unpredictable risks and/or possible complications that may result in a catastrophic outcome. The patient indicated having understood very clearly. We have given the patient no guarantees and we have made no promises. Enough time was given to the patient to ask questions, all of which were answered to the patient's satisfaction. Stanley Baxter has indicated that he wanted to continue with the procedure. Attestation: I, the ordering provider, attest that I have discussed with the patient the benefits, risks, side-effects, alternatives, likelihood of achieving goals, and potential problems during recovery  for the procedure that I have provided informed consent. Date  Time: 11/27/2019 11:26 AM  Pre-Procedure Preparation:  Monitoring: As per clinic protocol. Respiration, ETCO2, SpO2, BP, heart rate and rhythm monitor placed and checked for adequate function Safety Precautions: Patient was assessed for positional comfort and pressure points before starting the procedure. Time-out: I initiated and conducted the "Time-out" before starting the procedure, as per protocol. The patient was asked to participate by confirming the accuracy of the "Time Out" information. Verification of the correct person, site, and procedure were performed and confirmed by me, the nursing staff, and the patient. "Time-out" conducted as per Joint Commission's Universal Protocol (UP.01.01.01). Time: 1150  Description of Procedure:          Target Area: The interlaminar space, initially targeting the lower laminar border of the superior vertebral body. Approach: Paramedial approach. Area Prepped: Entire Posterior Lumbar Region DuraPrep (Iodine Povacrylex [0.7% available iodine] and Isopropyl Alcohol, 74% w/w) Safety Precautions: Aspiration looking for blood return was conducted prior to all injections. At no point did we inject any substances, as a needle was being advanced. No attempts were made at seeking any paresthesias. Safe injection practices and needle disposal techniques used. Medications properly checked for expiration dates. SDV (single dose vial) medications used. Description of the Procedure: Protocol guidelines were followed. The procedure needle was introduced through the skin, ipsilateral to the reported pain, and advanced to the target area. Bone was contacted and the needle walked caudad, until the lamina was cleared. The epidural space was identified using "loss-of-resistance technique" with 2-3 ml of PF-NaCl (0.9% NSS), in a 5cc LOR glass syringe.  Vitals:   11/27/19 1137 11/27/19 1150 11/27/19 1155  BP: 132/73  (!) 149/65 137/67  Pulse: 84    Resp: 20 20 (!) 22  Temp: 98.4 F (36.9 C)    TempSrc: Oral    SpO2: 97% 97% 97%  Weight: 178 lb (80.7 kg)    Height:  5\' 4"  (1.626 m)      Start Time: 1150 hrs. End Time: 1155 hrs.  Materials:  Needle(s) Type: Epidural needle Gauge: 22G Length: 3.5-in Medication(s): Please see orders for medications and dosing details. 6 cc solution made of 3 cc of preservative-free saline, 2 cc of 0.2% ropivacaine, 1 cc of Decadron 10 mg/cc.  Imaging Guidance (Spinal):          Type of Imaging Technique: Fluoroscopy Guidance (Spinal) Indication(s): Assistance in needle guidance and placement for procedures requiring needle placement in or near specific anatomical locations not easily accessible without such assistance. Exposure Time: Please see nurses notes. Contrast: Before injecting any contrast, we confirmed that the patient did not have an allergy to iodine, shellfish, or radiological contrast. Once satisfactory needle placement was completed at the desired level, radiological contrast was injected. Contrast injected under live fluoroscopy. No contrast complications. See chart for type and volume of contrast used. Fluoroscopic Guidance: I was personally present during the use of fluoroscopy. "Tunnel Vision Technique" used to obtain the best possible view of the target area. Parallax error corrected before commencing the procedure. "Direction-depth-direction" technique used to introduce the needle under continuous pulsed fluoroscopy. Once target was reached, antero-posterior, oblique, and lateral fluoroscopic projection used confirm needle placement in all planes. Images permanently stored in EMR. Interpretation: I personally interpreted the imaging intraoperatively. Adequate needle placement confirmed in multiple planes. Appropriate spread of contrast into desired area was observed. No evidence of afferent or efferent intravascular uptake. No intrathecal or subarachnoid  spread observed. Permanent images saved into the patient's record.  Antibiotic Prophylaxis:   Anti-infectives (From admission, onward)   None     Indication(s): None identified  Post-operative Assessment:  Post-procedure Vital Signs:  Pulse/HCG Rate: 8482 (nsr) Temp: 98.4 F (36.9 C) Resp: (!) 22 BP: 137/67 SpO2: 97 %  EBL: None  Complications: No immediate post-treatment complications observed by team, or reported by patient.  Note: The patient tolerated the entire procedure well. A repeat set of vitals were taken after the procedure and the patient was kept under observation following institutional policy, for this type of procedure. Post-procedural neurological assessment was performed, showing return to baseline, prior to discharge. The patient was provided with post-procedure discharge instructions, including a section on how to identify potential problems. Should any problems arise concerning this procedure, the patient was given instructions to immediately contact , at any time, without hesitation. In any case, we plan to contact the patient by telephone for a follow-up status report regarding this interventional procedure.  Comments:  No additional relevant information.  Plan of Care  Orders:  Orders Placed This Encounter  Procedures  . DG PAIN CLINIC C-ARM 1-60 MIN NO REPORT    Intraoperative interpretation by procedural physician at [facility].    Standing Status:   Standing    Number of Occurrences:   1    Order Specific Question:   Reason for exam:    Answer:   Assistance in needle guidance and placement for procedures requiring needle placement in or near specific anatomical locations not easily accessible without such assistance.   Medications ordered for procedure: Meds ordered this encounter  Medications  . iohexol (OMNIPAQUE) 180 MG/ML injection 10 mL    Must be Myelogram-compatible. If not available, you may substitute with a water-soluble,  non-ionic, hypoallergenic, myelogram-compatible radiological contrast medium.  [facility] lidocaine (XYLOCAINE) 2 % (with pres) injection 400 mg  . ropivacaine (PF) 2 mg/mL (0.2%) (NAROPIN) injection 2 mL  .  sodium chloride flush (NS) 0.9 % injection 2 mL  . dexamethasone (DECADRON) injection 10 mg   Medications administered: We administered iohexol, lidocaine, ropivacaine (PF) 2 mg/mL (0.2%), sodium chloride flush, and dexamethasone.  See the medical record for exact dosing, route, and time of administration.  Follow-up plan:   Return in about 3 weeks (around 12/18/2019) for Post Procedure Evaluation, in person.      L2/3 ESI 11/27/19: 6cc injected   Recent Visits Date Type Provider Dept  11/23/19 Office Visit Edward Jolly, MD [facility]  Showing recent visits within past 90 days and meeting all other requirements Today's Visits Date Type Provider Dept  11/27/19 Procedure visit Edward Jolly, MD [facility]  Showing today's visits and meeting all other requirements Future Appointments No visits were found meeting these conditions. Showing future appointments within next 90 days and meeting all other requirements  Disposition: Discharge home  Discharge (Date  Time): 11/27/2019; 1205 hrs.   Primary Care Physician: [facility] Location[facility] Note by: Edward Jolly, MD Date: 11/27/2019; Time: 12:02 PM  Disclaimer:  Medicine is not an exact science. The only guarantee in medicine is that nothing is guaranteed. It is important to note that the decision to proceed with this intervention was based on the information collected from the patient. The Data and conclusions were drawn from the patient's questionnaire, the interview, and the physical examination. Because the information was provided in large part by the patient, it cannot be guaranteed that it has not been purposely or unconsciously manipulated. Every effort has been made to  obtain as much relevant data as possible for this evaluation. It is important to note that the conclusions that lead to this procedure are derived in large part from the available data. Always take into account that the treatment will also be dependent on availability of resources and existing treatment guidelines, considered by other Pain Management Practitioners as being common knowledge and practice, at the time of the intervention. For Medico-Legal purposes, it is also important to point out that variation in procedural techniques and pharmacological choices are the acceptable norm. The indications, contraindications, technique, and results of the above procedure should only be interpreted and judged by a Board-Certified Interventional Pain Specialist with extensive familiarity and expertise in the same exact procedure and technique.

## 2019-11-28 ENCOUNTER — Telehealth: Payer: Self-pay

## 2019-11-28 NOTE — Telephone Encounter (Signed)
Post procedure phone call.  Patients daughter states he is doing well.  

## 2019-12-21 ENCOUNTER — Other Ambulatory Visit: Payer: Self-pay

## 2019-12-21 ENCOUNTER — Ambulatory Visit
Payer: Medicare Other | Attending: Student in an Organized Health Care Education/Training Program | Admitting: Student in an Organized Health Care Education/Training Program

## 2019-12-21 ENCOUNTER — Encounter: Payer: Self-pay | Admitting: Student in an Organized Health Care Education/Training Program

## 2019-12-21 VITALS — BP 134/60 | HR 95 | Temp 97.3°F | Resp 20 | Ht 64.0 in | Wt 178.0 lb

## 2019-12-21 DIAGNOSIS — G894 Chronic pain syndrome: Secondary | ICD-10-CM | POA: Diagnosis present

## 2019-12-21 DIAGNOSIS — G8929 Other chronic pain: Secondary | ICD-10-CM | POA: Insufficient documentation

## 2019-12-21 DIAGNOSIS — M48061 Spinal stenosis, lumbar region without neurogenic claudication: Secondary | ICD-10-CM | POA: Insufficient documentation

## 2019-12-21 DIAGNOSIS — M48062 Spinal stenosis, lumbar region with neurogenic claudication: Secondary | ICD-10-CM | POA: Insufficient documentation

## 2019-12-21 DIAGNOSIS — M5416 Radiculopathy, lumbar region: Secondary | ICD-10-CM | POA: Insufficient documentation

## 2019-12-21 NOTE — Progress Notes (Signed)
Safety precautions to be maintained throughout the outpatient stay will include: orient to surroundings, keep bed in low position, maintain call bell within reach at all times, provide assistance with transfer out of bed and ambulation.  

## 2019-12-21 NOTE — Progress Notes (Signed)
PROVIDER NOTE: Information contained herein reflects review and annotations entered in association with encounter. Interpretation of such information and data should be left to medically-trained personnel. Information provided to patient can be located elsewhere in the medical record under "Patient Instructions". Document created using STT-dictation technology, any transcriptional errors that may result from process are unintentional.    Patient: Stanley Baxter.  Service Category: E/M  Provider: Gillis Santa, MD  DOB: 1941-03-12  DOS: 12/21/2019  Specialty: Interventional Pain Management  MRN: [id number]  Setting: Ambulatory outpatient  PCP: G[facility]anna  Type: Established Patient    Referring Provider: [facility]  Location: Office  Delivery: Face-to-face     HPI  Reason for encounter: Mr. Stanley Baxter., a 79 y.o. year old male, is here today for evaluation and management of his Lumbar radiculopathy [M54.16]. [name]'s primary complain today is Leg Pain (bilateral) Last encounter: Practice (11/28/2019). My last encounter with him was on 11/27/2019. Pertinent problems: [name] has Lumbar degenerative disc disease; Chronic radicular lumbar pain; Spinal stenosis, lumbar region, with neurogenic claudication; Lumbar facet arthropathy; Chronic pain syndrome; and Lumbar foraminal stenosis on their pertinent problem list. Pain Assessment: Severity of Chronic pain is reported as a 6 /10. Location: Leg Right, Left/ . Onset: More than a month ago. Quality: Aching, Burning. Timing: Intermittent. Modifying factor(s): cream. Vitals:  height is '5\' 4"'  (1.626 m) and weight is 178 lb (80.7 kg). His temperature is 97.3 F (36.3 C) (abnormal). His blood pressure is 134/60 and his pulse is 95. His respiration is 20 and oxygen saturation is 99%.    Post-Procedure Evaluation  Procedure (11/27/2019):   Type: Diagnostic Inter-Laminar Epidural Steroid Injection  #1  Region:  Lumbar Level: L2-3 Level. Laterality: Midline    Sedation: Please see nurses note.  Effectiveness during initial hour after procedure(Ultra-Short Term Relief): 25 % .  Local anesthetic used: Long-acting (4-6 hours) Effectiveness: Defined as any analgesic benefit obtained secondary to the administration of local anesthetics. This carries significant diagnostic value as to the etiological location, or anatomical origin, of the pain. Duration of benefit is expected to coincide with the duration of the local anesthetic used.  Effectiveness during initial 4-6 hours after procedure(Short-Term Relief): 25 %  Long-term benefit: Defined as any relief past the pharmacologic duration of the local anesthetics.  Effectiveness past the initial 6 hours after procedure(Long-Term Relief): 50 %   Current benefits: Defined as benefit that persist at this time.   Analgesia:  50% improved Function: Somewhat improved ROM: Somewhat improved   ROS  Constitutional: Denies any fever or chills Gastrointestinal: No reported hemesis, hematochezia, vomiting, or acute GI distress Musculoskeletal: Low back, bilateral leg pain Neurological: No reported episodes of acute onset apraxia, aphasia, dysarthria, agnosia, amnesia, paralysis, loss of coordination, or loss of consciousness  Medication Review  Budesonide, Cholecalciferol, DULoxetine, Melatonin-Pyridoxine, acetaminophen, amLODipine, cetirizine, diclofenac Sodium, prazosin, pregabalin, sildenafil, and tamsulosin  History Review  Allergy: [name] has No Known Allergies. Drug: [name]  reports no history of drug use. Alcohol:  reports previous alcohol use. Tobacco:  reports that he has never smoked. He has never used smokeless tobacco. Social: [name]  reports that he has never smoked. He has never used smokeless tobacco. He reports previous alcohol use. He reports that he does not use drugs. Medical:  has a past medical history of Abnormal  echocardiogram (2016), Abscess after procedure, Anxiety, DDD (degenerative disc disease), lumbar (09/20/2013), Hyperlipidemia, unspecified (09/20/2013), and Hypertension. Surgical: [name]  has a past surgical history that includes Hernia repair (2008). Family: family history includes Cancer in his mother; Liver disease in his father.  Laboratory Chemistry Profile   Renal Lab Results  Component Value Date   BUN 15 09/25/2019   CREATININE 0.96 09/25/2019   GFRAA >60 09/25/2019   GFRNONAA >60 09/25/2019     Hepatic Lab Results  Component Value Date   AST 38 06/20/2016   ALT 27 06/20/2016   ALBUMIN 4.2 06/20/2016   ALKPHOS 69 06/20/2016   LIPASE 33 06/19/2016     Electrolytes Lab Results  Component Value Date   NA 139 09/25/2019   K 3.9 09/25/2019   CL 103 09/25/2019   CALCIUM 9.0 09/25/2019     Bone No results found for: VD25OH, [id number], JW1191YN8, GN5621HY8, 25OHVITD1, 25OHVITD2, 25OHVITD3, TESTOFREE, TESTOSTERONE   Inflammation (CRP: Acute Phase) (ESR: Chronic Phase) Lab Results  Component Value Date   LATICACIDVEN 1.0 06/20/2016       Note: Above Lab results reviewed.  Recent Imaging Review  DG PAIN [facility] C-ARM 1-60 MIN NO REPORT Fluoro was used, but no Radiologist interpretation will be provided.  Please refer to "NOTES" tab for provider progress note. Note: Reviewed        Physical Exam  General appearance: Well nourished, well developed, and well hydrated. In no apparent acute distress Mental status: Alert, oriented x 3 (person, place, & time)       Respiratory: No evidence of acute respiratory distress Eyes: PERLA Vitals: BP 134/60    Pulse 95    Temp (!) 97.3 F (36.3 C)    Resp 20    Ht '5\' 4"'  (1.626 m)    Wt 178 lb (80.7 kg)    SpO2 99%    BMI 30.55 kg/m  BMI: Estimated body mass index is 30.55 kg/m as calculated from the following:   Height as of this encounter: '5\' 4"'  (1.626 m).   Weight as of this encounter: 178 lb (80.7 kg). Ideal: Ideal  body weight: 59.2 kg (130 lb 8.2 oz) Adjusted ideal body weight: 67.8 kg (149 lb 8.1 oz)   Lumbar Exam  Skin & Axial Inspection: No masses, redness, or swelling Alignment: Symmetrical Functional ROM:  Pain restricted range of motion, improved from before       Stability: No instability detected Muscle Tone/Strength: Functionally intact. No obvious neuro-muscular anomalies detected. Sensory (Neurological): Dermatomal pain pattern and musculoskeletal, improved from before Palpation: No palpable anomalies       Provocative Tests: Hyperextension/rotation test: (+) bilaterally for facet joint pain. Lumbar quadrant test (Kemp's test): (+) bilateral for foraminal stenosis, improved from before Lateral bending test: (+) ipsilateral radicular pain, bilaterally. Positive for bilateral foraminal stenosis. Patrick's Maneuver: deferred today                   FABER* test: deferred today                   S-I anterior distraction/compression test: deferred today         S-I lateral compression test: deferred today         S-I Thigh-thrust test: deferred today         S-I Gaenslen's test: deferred today         *(Flexion, ABduction and External Rotation)  Gait & Posture Assessment  Ambulation: Patient ambulates using a cane Gait: Limited. Using assistive device to ambulate Posture: Difficulty standing up straight, due to pain   Lower Extremity Exam  Side: Right lower extremity  Side: Left lower extremity  Stability: No instability observed          Stability: No instability observed          Skin & Extremity Inspection: Skin color, temperature, and hair growth are WNL. No peripheral edema or cyanosis. No masses, redness, swelling, asymmetry, or associated skin lesions. No contractures.  Skin & Extremity Inspection: Skin color, temperature, and hair growth are WNL. No peripheral edema or cyanosis. No masses, redness, swelling, asymmetry, or associated skin lesions. No contractures.   Functional ROM: Pain restricted ROM for hip and knee joints           Functional ROM: Pain restricted ROM for hip and knee joints          Muscle Tone/Strength: Functionally intact. No obvious neuro-muscular anomalies detected.  Muscle Tone/Strength: Functionally intact. No obvious neuro-muscular anomalies detected.  Sensory (Neurological): Unimpaired        Sensory (Neurological): Unimpaired        DTR: Patellar: deferred today Achilles: deferred today Plantar: deferred today  DTR: Patellar: deferred today Achilles: deferred today Plantar: deferred today  Palpation: No palpable anomalies  Palpation: No palpable anomalies     Assessment   Status Diagnosis  Responding Persistent Persistent 1. Lumbar radiculopathy (L2/3 and L3/4)   2. Spinal stenosis, lumbar region, with neurogenic claudication   3. Lumbar foraminal stenosis   4. Chronic radicular lumbar pain   5. Chronic pain syndrome      Updated Problems: Problem  Lumbar Foraminal Stenosis  Chronic Radicular Lumbar Pain  Spinal Stenosis, Lumbar Region, With Neurogenic Claudication  Lumbar Facet Arthropathy  Chronic Pain Syndrome  Lumbar Degenerative Disc Disease    Plan of Care   Positive diagnostic lumbar epidural steroid injection with approximately 50% pain relief and improvement in functional status, repeat in 4 to 6 weeks if lumbar radicular pain worsens or decline in functional status.  Orders:  Orders Placed This Encounter  Procedures   Lumbar Epidural Injection    Standing Status:   Future    Standing Expiration Date:   01/21/2020    Scheduling Instructions:     Procedure: Interlaminar Lumbar Epidural Steroid injection (LESI)            Laterality: Midline     Sedation: without     Timeframe: ASAA    Order Specific Question:   Where will this procedure be performed?    Answer:   [facility]   Follow-up plan:   Return in about 6 weeks (around 02/01/2020) for L-ESI , without sedation.      L2/3 ESI 11/27/19: 6cc injected    Recent Visits Date Type Provider Dept  11/27/19 Procedure visit Gillis Santa, MD [facility]  11/23/19 Office Visit Gillis Santa, MD [facility]  Showing recent visits within past 90 days and meeting all other requirements Today's Visits Date Type Provider Dept  12/21/19 Office Visit Gillis Santa, MD [facility]  Showing today's visits and meeting all other requirements Future Appointments Date Type Provider Dept  01/31/20 Appointment Gillis Santa, MD [facility]  Showing future appointments within next 90 days and meeting all other requirements  I discussed the assessment and treatment plan with the patient. The patient was provided an opportunity to ask questions and all were answered. The patient agreed with the plan and demonstrated an understanding of the instructions.  Patient advised to call back or seek an in-person evaluation if the symptoms  or condition worsens.  Duration of encounter: 30 minutes.  Note by: Gillis Santa, MD Date: 12/21/2019; Time: 9:07 AM

## 2019-12-21 NOTE — Patient Instructions (Signed)
Preparing for your procedure (without sedation) Instructions: . Oral Intake: Do not eat or drink anything for at least 3 hours prior to your procedure. . Transportation: Unless otherwise stated by your physician, you may drive yourself after the procedure. . Blood Pressure Medicine: Take your blood pressure medicine with a sip of water the morning of the procedure. . Insulin: Take only  of your normal insulin dose. . Preventing infections: Shower with an antibacterial soap the morning of your procedure. . Build-up your immune system: Take 1000 mg of Vitamin C with every meal (3 times a day) the day prior to your procedure. . Pregnancy: If you are pregnant, call and cancel the procedure. . Sickness: If you have a cold, fever, or any active infections, call and cancel the procedure. . Arrival: You must be in the facility at least 30 minutes prior to your scheduled procedure. . Children: Do not bring any children with you. . Dress appropriately: Bring dark clothing that you would not mind if they get stained. . Valuables: Do not bring any jewelry or valuables. Procedure appointments are reserved for interventional treatments only. . No Prescription Refills. . No medication changes will be discussed during procedure appointments. No disability issues will be discussed.Epidural Steroid Injection Patient Information  Description: The epidural space surrounds the nerves as they exit the spinal cord.  In some patients, the nerves can be compressed and inflamed by a bulging disc or a tight spinal canal (spinal stenosis).  By injecting steroids into the epidural space, we can bring irritated nerves into direct contact with a potentially helpful medication.  These steroids act directly on the irritated nerves and can reduce swelling and inflammation which often leads to decreased pain.  Epidural steroids may be injected anywhere along the spine and from the neck to the low back depending upon the location  of your pain.   After numbing the skin with local anesthetic (like Novocaine), a small needle is passed into the epidural space slowly.  You may experience a sensation of pressure while this is being done.  The entire block usually last less than 10 minutes.  Conditions which may be treated by epidural steroids:  Low back and leg pain Neck and arm pain Spinal stenosis Post-laminectomy syndrome Herpes zoster (shingles) pain Pain from compression fractures  Preparation for the injection:  Do not eat any solid food or dairy products within 8 hours of your appointment.  You may drink clear liquids up to 3 hours before appointment.  Clear liquids include water, black coffee, juice or soda.  No milk or cream please. You may take your regular medication, including pain medications, with a sip of water before your appointment  Diabetics should hold regular insulin (if taken separately) and take 1/2 normal NPH dos the morning of the procedure.  Carry some sugar containing items with you to your appointment. A driver must accompany you and be prepared to drive you home after your procedure.  Bring all your current medications with your. An IV may be inserted and sedation may be given at the discretion of the physician.   A blood pressure cuff, EKG and other monitors will often be applied during the procedure.  Some patients may need to have extra oxygen administered for a short period. You will be asked to provide medical information, including your allergies, prior to the procedure.  We must know immediately if you are taking blood thinners (like Coumadin/Warfarin)  Or if you are allergic to IV iodine contrast (  dye). We must know if you could possible be pregnant.  Possible side-effects: Bleeding from needle site Infection (rare, may require surgery) Nerve injury (rare) Numbness & tingling (temporary) Difficulty urinating (rare, temporary) Spinal headache ( a headache worse with upright  posture) Light -headedness (temporary) Pain at injection site (several days) Decreased blood pressure (temporary) Weakness in arm/leg (temporary) Pressure sensation in back/neck (temporary)  Call if you experience: Fever/chills associated with headache or increased back/neck pain. Headache worsened by an upright position. New onset weakness or numbness of an extremity below the injection site Hives or difficulty breathing (go to the emergency room) Inflammation or drainage at the infection site Severe back/neck pain Any new symptoms which are concerning to you  Please note:  Although the local anesthetic injected can often make your back or neck feel good for several hours after the injection, the pain will likely return.  It takes 3-7 days for steroids to work in the epidural space.  You may not notice any pain relief for at least that one week.  If effective, we will often do a series of three injections spaced 3-6 weeks apart to maximally decrease your pain.  After the initial series, we generally will wait several months before considering a repeat injection of the same type.  If you have any questions, please call [phone] . [facility] 

## 2020-01-31 ENCOUNTER — Ambulatory Visit
Payer: No Typology Code available for payment source | Admitting: Student in an Organized Health Care Education/Training Program

## 2020-08-14 ENCOUNTER — Ambulatory Visit
Payer: No Typology Code available for payment source | Admitting: Student in an Organized Health Care Education/Training Program

## 2020-09-10 ENCOUNTER — Encounter: Payer: Self-pay | Admitting: Student in an Organized Health Care Education/Training Program

## 2020-09-10 ENCOUNTER — Other Ambulatory Visit: Payer: Self-pay

## 2020-09-10 ENCOUNTER — Ambulatory Visit
Payer: No Typology Code available for payment source | Attending: Student in an Organized Health Care Education/Training Program | Admitting: Student in an Organized Health Care Education/Training Program

## 2020-09-10 VITALS — BP 122/65 | HR 73 | Temp 97.1°F | Resp 16 | Ht 64.0 in | Wt 173.0 lb

## 2020-09-10 DIAGNOSIS — M48062 Spinal stenosis, lumbar region with neurogenic claudication: Secondary | ICD-10-CM | POA: Insufficient documentation

## 2020-09-10 DIAGNOSIS — M5416 Radiculopathy, lumbar region: Secondary | ICD-10-CM | POA: Diagnosis not present

## 2020-09-10 DIAGNOSIS — G894 Chronic pain syndrome: Secondary | ICD-10-CM | POA: Insufficient documentation

## 2020-09-10 DIAGNOSIS — M48061 Spinal stenosis, lumbar region without neurogenic claudication: Secondary | ICD-10-CM | POA: Insufficient documentation

## 2020-09-10 NOTE — Progress Notes (Signed)
PROVIDER NOTE: Information contained herein reflects review and annotations entered in association with encounter. Interpretation of such information and data should be left to medically-trained personnel. Information provided to patient can be located elsewhere in the medical record under "Patient Instructions". Document created using STT-dictation technology, any transcriptional errors that may result from process are unintentional.    Patient: Stanley Baxter.  Service Category: E/M  Provider: Gillis Santa, MD  DOB: Nov 22, 1940  DOS: 09/10/2020  Specialty: Interventional Pain Management  MRN: [id number]  Setting: Ambulatory outpatient  PCP: [facility]  Type: Established Patient    Referring Provider: [facility]  Location: Office  Delivery: Face-to-face     HPI  Mr. Stanley Portnoy., a 80 y.o. year old male, is here today because of his Lumbar radiculopathy [M54.16]. Mr. Stanley Baxter primary complain today is Back Pain, Leg Pain, and Foot Pain  Pertinent problems: Mr. Stanley Baxter has Lumbar degenerative disc disease; Chronic radicular lumbar pain; Spinal stenosis, lumbar region, with neurogenic claudication; Lumbar facet arthropathy; Chronic pain syndrome; and Lumbar foraminal stenosis on their pertinent problem list. Pain Assessment: Severity of Chronic pain is reported as a 10-Worst pain ever/10. Location: Back Lower/radiates into bilateral legs and feet. Onset: More than a month ago. Quality: Burning (stinging). Timing: Constant. Modifying factor(s): nothing. Vitals:  height is _0  (1.626 m) and weight is 173 lb (78.5 kg). His temporal temperature is 97.1 F (36.2 C) (abnormal). His blood pressure is 122/65 and his pulse is 73. His respiration is 16 and oxygen saturation is 99%.   Reason for encounter: worsening of previously known (established) problem    Stanley Baxter presents today with low back pain with radiation into bilateral hips, bilateral lower  extremities right greater than left in a dermatomal fashion related to chronic lumbar radicular pain, lumbar foraminal stenosis, lumbar spinal stenosis with neurogenic claudication.  Patient previously had a L2-L3 lumbar epidural steroid injection with me on November 27, 2019.  This provided him with significant pain relief for greater than 6 months.  He states that over the last 8 weeks he has noticed an increase in his low back pain with associated numbness and weakness in his buttocks and upper thighs.  He continues Lyrica 150 mg twice a day as well as Cymbalta 30 mg daily.  We discussed repeating lumbar epidural steroid injection.  Risks and benefits reviewed and patient would like to proceed.    ROS  Constitutional: Denies any fever or chills Gastrointestinal: No reported hemesis, hematochezia, vomiting, or acute GI distress Musculoskeletal: Denies any acute onset joint swelling, redness, loss of ROM, or weakness Neurological: Bilateral lower extremity numbness tingling  Medication Review  Budesonide, Cholecalciferol, DULoxetine, Melatonin-Pyridoxine, acetaminophen, amLODipine, cetirizine, diclofenac Sodium, prazosin, pregabalin, sildenafil, and tamsulosin  History Review  Allergy: Stanley Baxter is allergic to lisinopril and duloxetine. Drug: Stanley Baxter  reports no history of drug use. Alcohol:  reports previous alcohol use. Tobacco:  reports that he has never smoked. He has never used smokeless tobacco. Social: Stanley Baxter  reports that he has never smoked. He has never used smokeless tobacco. He reports previous alcohol use. He reports that he does not use drugs. Medical:  has a past medical history of Abnormal echocardiogram (2016), Abscess after procedure, Anxiety, DDD (degenerative disc disease), lumbar (09/20/2013), Hyperlipidemia, unspecified (09/20/2013), and Hypertension. Surgical: Stanley Baxter  has a past surgical history that includes Hernia repair (2008). Family: family history includes  Cancer in his mother; Liver disease in his father.  Laboratory Chemistry Profile   Renal Lab Results  Component Value Date   BUN 15 09/25/2019   CREATININE 0.96 09/25/2019   GFRAA >60 09/25/2019   GFRNONAA >60 09/25/2019     Hepatic Lab Results  Component Value Date   AST 38 06/20/2016   ALT 27 06/20/2016   ALBUMIN 4.2 06/20/2016   ALKPHOS 69 06/20/2016   LIPASE 33 06/19/2016     Electrolytes Lab Results  Component Value Date   NA 139 09/25/2019   K 3.9 09/25/2019   CL 103 09/25/2019   CALCIUM 9.0 09/25/2019     Bone No results found for: VD25OH, [id number], QV9563OV5, [id number], 25OHVITD1, 25OHVITD2, 25OHVITD3, TESTOFREE, TESTOSTERONE   Inflammation (CRP: Acute Phase) (ESR: Chronic Phase) Lab Results  Component Value Date   LATICACIDVEN 1.0 06/20/2016       Note: Above Lab results reviewed.  Recent Imaging Review    EXAM: CT LUMBAR SPINE WITHOUT CONTRAST  TECHNIQUE: Multidetector CT imaging of the lumbar spine was performed without intravenous contrast administration. Multiplanar CT image reconstructions were also generated.  COMPARISON:  X-ray 03/03/2018  FINDINGS: Segmentation: 5 lumbar type vertebrae.  Alignment: 3 mm retrolisthesis L2 on L3. No evidence of traumatic listhesis. Facet joint alignment is maintained.  Vertebrae: Vertebral body heights are maintained without acute fracture. No bone lesion.  Paraspinal and other soft tissues: 5 mm rounded metallic foreign body within the subcutaneous soft tissues overlying the medial aspect of the left gluteal musculature (series 6, image 130). Mild volume of stool throughout the visualized:Marland Kitchen Mild diffuse urinary bladder wall thickening.  Disc levels:  T12-L1: No large disc protrusion. No evidence of foraminal or canal stenosis.  L1-L2: No large disc protrusion. No evidence of foraminal or canal stenosis.  L2-L3: Slight retrolisthesis with associated diffuse disc bulge. Mild  bilateral facet arthrosis and buckling of the ligamentum flavum contributes to moderate canal stenosis and moderate bilateral foraminal stenosis.  L3-L4: Diffuse disc bulge with mild bilateral facet arthrosis and buckling of the ligamentum flavum contribute to moderate canal stenosis and mild bilateral foraminal stenosis.  L4-L5: Diffuse disc bulge with mild bilateral facet arthrosis and ligamentum flavum buckling result in borderline/mild canal stenosis and mild bilateral foraminal stenosis.  L5-S1: Mild diffuse disc bulge and mild bilateral facet arthrosis without evidence of foraminal or canal stenosis.  IMPRESSION: 1. 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 Findings are most pronounced at L2-3 where there it appears to be moderate canal stenosis with moderate bilateral foraminal stenosis. There is also moderate canal stenosis at the L3-4 level. 2. Mild diffuse urinary bladder wall thickening. Correlate for cystitis. 3. 5 mm rounded metallic foreign body within the subcutaneous soft tissues overlying the medial aspect of the left gluteal musculature.   Physical Exam  General appearance: Well nourished, well developed, and well hydrated. In no apparent acute distress Mental status: Alert, oriented x 3 (person, place, & time)       Respiratory: No evidence of acute respiratory distress Eyes: PERLA Vitals: BP 122/65 (BP Location: Right Arm, Patient Position: Sitting, Cuff Size: Normal)   Pulse 73   Temp (!) 97.1 F (36.2 C) (Temporal)   Resp 16   Ht _0  (1.626 m)   Wt 173 lb (78.5 kg)   SpO2 99%   BMI 29.70 kg/m  BMI: Estimated body mass index is 29.7 kg/m as calculated from the following:   Height as of this encounter: _1  (1.626 m).   Weight as of this encounter:  173 lb (78.5 kg). Ideal: Ideal body weight: 59.2 kg (130 lb 8.2 oz) Adjusted ideal body weight: 66.9 kg (147 lb 8.1 oz)  Lumbar Exam  Skin & Axial  Inspection: No masses, redness, or swelling Alignment: Symmetrical Functional ROM: Unrestricted ROM       Stability: No instability detected Muscle Tone/Strength: Functionally intact. No obvious neuro-muscular anomalies detected. Sensory (Neurological): Dermatomal pain pattern and musculoskeletal Palpation: No palpable anomalies       Provocative Tests: Hyperextension/rotation test: (+) bilaterally for facet joint pain. Lumbar quadrant test (Kemp's test): (+) bilateral for foraminal stenosis Lateral bending test: (+) ipsilateral radicular pain, bilaterally. Positive for bilateral foraminal stenosis. Patrick's Maneuver: deferred today                   FABER* test: deferred today                   S-I anterior distraction/compression test: deferred today         S-I lateral compression test: deferred today         S-I Thigh-thrust test: deferred today         S-I Gaenslen's test: deferred today         *(Flexion, ABduction and External Rotation)  Gait & Posture Assessment  Ambulation: Patient ambulates using a cane Gait: Limited. Using assistive device to ambulate Posture: Difficulty standing up straight, due to pain   Lower Extremity Exam    Side: Right lower extremity  Side: Left lower extremity  Stability: No instability observed          Stability: No instability observed          Skin & Extremity Inspection: Skin color, temperature, and hair growth are WNL. No peripheral edema or cyanosis. No masses, redness, swelling, asymmetry, or associated skin lesions. No contractures.  Skin & Extremity Inspection: Skin color, temperature, and hair growth are WNL. No peripheral edema or cyanosis. No masses, redness, swelling, asymmetry, or associated skin lesions. No contractures.  Functional ROM: Pain restricted ROM for hip and knee joints           Functional ROM: Pain restricted ROM for hip and knee joints          Muscle Tone/Strength: Functionally intact. No obvious neuro-muscular  anomalies detected.  Muscle Tone/Strength: Functionally intact. No obvious neuro-muscular anomalies detected.  Sensory (Neurological): Unimpaired        Sensory (Neurological): Unimpaired        DTR: Patellar: deferred today Achilles: deferred today Plantar: deferred today  DTR: Patellar: deferred today Achilles: deferred today Plantar: deferred today  Palpation: No palpable anomalies  Palpation: No palpable anomalies     Assessment   Status Diagnosis  Having a Flare-up Having a Flare-up Having a Flare-up 1. Lumbar radiculopathy (L2/3 and L3/4)   2. Spinal stenosis, lumbar region, with neurogenic claudication   3. Lumbar foraminal stenosis   4. Chronic pain syndrome       Plan of Care   Mr. Stanley Baumgardner. has a current medication list which includes the following long-term medication(s): amlodipine, budesonide, cetirizine, duloxetine, prazosin, pregabalin, and sildenafil.   Orders:  Orders Placed This Encounter  Procedures  . Lumbar Epidural Injection    Standing Status:   Future    Standing Expiration Date:   10/11/2020    Scheduling Instructions:     Procedure: Interlaminar Lumbar Epidural Steroid injection (LESI)      L2/3      Laterality: Midline  Sedation: Patient's choice.     Timeframe: ASAA    Order Specific Question:   Where will this procedure be performed?    Answer:   [facility]   Follow-up plan:   Return in about 2 weeks (around 09/24/2020) for R L2/3 ESI , without sedation.     L2/3 ESI 11/27/19: 6cc injected     Recent Visits No visits were found meeting these conditions. Showing recent visits within past 90 days and meeting all other requirements Today's Visits Date Type Provider Dept  09/10/20 Office Visit Gillis Santa, MD [facility]  Showing today's visits and meeting all other requirements Future Appointments No visits were found meeting these conditions. Showing future appointments within next 90 days and  meeting all other requirements  I discussed the assessment and treatment plan with the patient. The patient was provided an opportunity to ask questions and all were answered. The patient agreed with the plan and demonstrated an understanding of the instructions.  Patient advised to call back or seek an in-person evaluation if the symptoms or condition worsens.  Duration of encounter: 2mnutes.  Note by: BGillis Santa MD Date: 09/10/2020; Time: 10:14 AM

## 2020-09-10 NOTE — Patient Instructions (Addendum)
APAP 500 mg q6 hours as needed  Follow up for L2/3 ESIPreparing for your procedure (without sedation) Instructions: . Oral Intake: Do not eat or drink anything for at least 3 hours prior to your procedure. . Transportation: Unless otherwise stated by your physician, you may drive yourself after the procedure. . Blood Pressure Medicine: Take your blood pressure medicine with a sip of water the morning of the procedure. . Insulin: Take only  of your normal insulin dose. . Preventing infections: Shower with an antibacterial soap the morning of your procedure. . Build-up your immune system: Take 1000 mg of Vitamin C with every meal (3 times a day) the day prior to your procedure. . Pregnancy: If you are pregnant, call and cancel the procedure. . Sickness: If you have a cold, fever, or any active infections, call and cancel the procedure. . Arrival: You must be in the facility at least 30 minutes prior to your scheduled procedure. . Children: Do not bring any children with you. . Dress appropriately: Bring dark clothing that you would not mind if they get stained. . Valuables: Do not bring any jewelry or valuables. Procedure appointments are reserved for interventional treatments only. Marland Kitchen No Prescription Refills. . No medication changes will be discussed during procedure appointments. No disability issues will be discussed.

## 2020-09-10 NOTE — Progress Notes (Signed)
Safety precautions to be maintained throughout the outpatient stay will include: orient to surroundings, keep bed in low position, maintain call bell within reach at all times, provide assistance with transfer out of bed and ambulation.  

## 2020-09-11 ENCOUNTER — Emergency Department: Payer: No Typology Code available for payment source

## 2020-09-11 ENCOUNTER — Other Ambulatory Visit: Payer: Self-pay

## 2020-09-11 ENCOUNTER — Emergency Department
Admission: EM | Admit: 2020-09-11 | Discharge: 2020-09-11 | Disposition: A | Discharge status: Home / Self Care | Payer: No Typology Code available for payment source | Attending: Emergency Medicine | Admitting: Emergency Medicine

## 2020-09-11 DIAGNOSIS — G8929 Other chronic pain: Secondary | ICD-10-CM | POA: Insufficient documentation

## 2020-09-11 DIAGNOSIS — M545 Low back pain, unspecified: Secondary | ICD-10-CM | POA: Insufficient documentation

## 2020-09-11 DIAGNOSIS — I10 Essential (primary) hypertension: Secondary | ICD-10-CM | POA: Diagnosis not present

## 2020-09-11 DIAGNOSIS — Z79899 Other long term (current) drug therapy: Secondary | ICD-10-CM | POA: Insufficient documentation

## 2020-09-11 LAB — URINALYSIS, COMPLETE (UACMP) WITH MICROSCOPIC
Bacteria, UA: NONE SEEN
Bilirubin Urine: NEGATIVE
Glucose, UA: NEGATIVE mg/dL
Hgb urine dipstick: NEGATIVE
Ketones, ur: 5 mg/dL — AB
Leukocytes,Ua: NEGATIVE
Nitrite: NEGATIVE
Protein, ur: NEGATIVE mg/dL
Specific Gravity, Urine: 1.029 (ref 1.005–1.030)
pH: 5 (ref 5.0–8.0)

## 2020-09-11 MED ORDER — TRAMADOL HCL 50 MG PO TABS: 50.0000 mg | ORAL_TABLET | Freq: Four times a day (QID) | ORAL | 0 refills | Status: AC | PRN

## 2020-09-11 MED ORDER — TRAMADOL HCL 50 MG PO TABS
50.0000 mg | ORAL_TABLET | Freq: Once | ORAL | Status: AC
  Administered 2020-09-11: 50 mg via ORAL
  Filled 2020-09-11: qty 1

## 2020-09-11 MED ORDER — DEXAMETHASONE SODIUM PHOSPHATE 10 MG/ML IJ SOLN: 10.0000 mg | Freq: Once | INTRAMUSCULAR | Status: DC

## 2020-09-11 NOTE — ED Triage Notes (Signed)
Pt comes with c/o lower back pain. Pt states this started last few weeks. Pt denies any recent injuries.

## 2020-09-11 NOTE — ED Provider Notes (Signed)
[facility] Emergency Department Provider Note ____________________________________________  Time seen: Approximately 5:38 PM  I have reviewed the triage vital signs and the nursing notes.  HISTORY  Chief Complaint Back Pain   HPI Stanley Baxter. is a 80 y.o. male who presents to the emergency department for treatment and evaluation of lower back pain that radiates into both legs. Pain has been present for the past 2 months. He had similar symptoms last year and had a steroid injection in his back that seemed to help for quite a while. No new injury. Pain is worse with movement. He is on medication for neuropathy, but otherwise has not attempted any alleviating measures at home.   Past Medical History:  Diagnosis Date  . Abnormal echocardiogram 2016   Dr. Juliann Pares  . Abscess after procedure   . Anxiety   . DDD (degenerative disc disease), lumbar 09/20/2013  . Hyperlipidemia, unspecified 09/20/2013  . Hypertension     Patient Active Problem List   Diagnosis Date Noted  . Lumbar foraminal stenosis 11/27/2019  . Chronic radicular lumbar pain 11/23/2019  . Spinal stenosis, lumbar region, with neurogenic claudication 11/23/2019  . Lumbar facet arthropathy 11/23/2019  . Chronic pain syndrome 11/23/2019  . Anxiety   . Abnormal echocardiogram 04/27/2014  . Lumbar degenerative disc disease 09/20/2013  . HTN (hypertension) 09/20/2013  . Hyperlipidemia, unspecified 09/20/2013    Past Surgical History:  Procedure Laterality Date  . HERNIA REPAIR  2008   Dr. Excell Seltzer    Prior to Admission medications   Medication Sig Start Date End Date Taking? Authorizing Provider  traMADol (ULTRAM) 50 MG tablet Take 1 tablet (50 mg total) by mouth every 6 (six) hours as needed. 09/11/20  Yes Denys Labree B, FNP  acetaminophen (TYLENOL) 325 MG tablet Take 650 mg by mouth every 6 (six) hours as needed for mild pain.    [provider]  amLODipine (NORVASC) 10 MG  tablet Take 10 mg by mouth daily. 1/2 tab daily    [provider]  BUDESONIDE IN Inhale 1 spray into the lungs. Each nostril daily    [provider]  cetirizine (ZYRTEC) 10 MG tablet Take 10 mg by mouth daily.    [provider]  Cholecalciferol 25 MCG (1000 UT) tablet Take 1,000 Units by mouth daily.    [provider]  diclofenac Sodium (VOLTAREN) 1 % GEL Apply 2-4 g topically 4 (four) times daily.    [provider]  DULoxetine (CYMBALTA) 30 MG capsule Take 30 mg by mouth daily.    [provider]  Melatonin-Pyridoxine (MELATIN PO) Take 3 mg by mouth at bedtime.    [provider]  prazosin (MINIPRESS) 1 MG capsule Take 1 mg by mouth at bedtime.    [provider]  pregabalin (LYRICA) 150 MG capsule Take 150 mg by mouth 2 (two) times daily.    [provider]  sildenafil (VIAGRA) 100 MG tablet Take 100 mg by mouth daily as needed for erectile dysfunction.    [provider]  tamsulosin (FLOMAX) 0.4 MG CAPS capsule Take 0.4 mg by mouth.    [provider]    Allergies Lisinopril and Duloxetine  Family History  Problem Relation Age of Onset  . Cancer Mother   . Liver disease Father     Social History Social History   Tobacco Use  . Smoking status: Never Smoker  . Smokeless tobacco: Never Used  Substance Use Topics  . Alcohol use: Not  Currently  . Drug use: No    Review of Systems Constitutional: Well appearing. Respiratory: Negative for dyspnea. Cardiovascular: Negative for change in skin temperature or color. Musculoskeletal:   Negative for chronic steroid use   Negative for trauma in the presence of osteoporosis  Negative for age over 11 and trauma.  Negative for constitutional symptoms, or history of cancer  Negative for pain worse at night. Skin: Negative for rash, lesion, or wound.  Genitourinary: Negative for urinary retention. Rectal: Negative for fecal  incontinence or new onset constipation/bowel habit changes. Hematological/Immunilogical: Negative for immunosuppression, IV drug use, or fever Neurological: Positive for burning, tingling, numb, electric, radiating pain in the lower extremities..                        Negative for saddle anesthesia.                        Negative for focal neurologic deficit, progressive or disabling symptoms             Negative for saddle anesthesia. ____________________________________________   PHYSICAL EXAM:  VITAL SIGNS: ED Triage Vitals  Enc Vitals Group     BP 09/11/20 1624 112/75     Pulse Rate 09/11/20 1624 90     Resp 09/11/20 1624 17     Temp 09/11/20 1624 98.6 F (37 C)     Temp src --      SpO2 09/11/20 1624 96 %     Weight --      Height --      Head Circumference --      Peak Flow --      Pain Score 09/11/20 1623 10     Pain Loc --      Pain Edu? --      Excl. in GC? --     Constitutional: Alert and oriented. Well appearing and in no acute distress. Eyes: Conjunctivae are clear without discharge or drainage.  Head: Atraumatic. Neck: Full, active range of motion. Respiratory: Respirations even and unlabored. Musculoskeletal: Appropriate for age ROM of the back and extremities, Strength 5/5 of the lower extremities as tested. Neurologic: Reflexes of the lower extremities are 2+. Negative straight leg raise on the right and left side. Skin: Atraumatic.  Psychiatric: Behavior and affect are normal.  ____________________________________________   LABS (all labs ordered are listed, but only abnormal results are displayed)  Labs Reviewed  URINALYSIS, COMPLETE (UACMP) WITH MICROSCOPIC - Abnormal; Notable for the following components:      Result Value   Color, Urine YELLOW (*)    APPearance CLEAR (*)    Ketones, ur 5 (*)    All other components within normal limits   ____________________________________________  RADIOLOGY  Lumbar imaging negative for acute  findings. ____________________________________________   PROCEDURES  Procedure(s) performed:  Procedures ____________________________________________   INITIAL IMPRESSION / ASSESSMENT AND PLAN / ED COURSE  Stanley Baxter. is a 80 y.o. male who presents to the emergency department for treatment and evaluation of low back pain.  See HPI for further details.  Patient states that he saw Dr. Cherylann Ratel yesterday with the intention of having a steroid injection.  No injection was given yesterday but was scheduled for June 6.   Based on Dr. Garnett Farm note from yesterday, he has chronic radicular lumbar pain, spinal stenosis in the lumbar region with neurogenic claudication, lumbar facet arthropathy and lumbar foraminal stenosis. Patient reports compliance with his  medications including Lyrica and Cymbalta. He tells me he is here today for an x-ray and to get something to help with pain. Although imaging is unlikely to show any acute findings, the last one I see was 2019 so another was ordered today. Tramadol ordered as well.  Imaging negative for acute findings. Injection of Decadron given and he is to keep his follow up with Dr. Cherylann Ratel as scheduled.  Medications  dexamethasone (DECADRON) injection 10 mg (10 mg Intramuscular Not Given 09/11/20 1932)  traMADol (ULTRAM) tablet 50 mg (50 mg Oral Given 09/11/20 1831)    ED Discharge Orders         Ordered    traMADol (ULTRAM) 50 MG tablet  Every 6 hours PRN        09/11/20 1905           Pertinent labs & imaging results that were available during my care of the patient were reviewed by me and considered in my medical decision making (see chart for details).  _________________________________________   FINAL CLINICAL IMPRESSION(S) / ED DIAGNOSES  Final diagnoses:  Chronic bilateral low back pain, unspecified whether sciatica present     If controlled substance prescribed during this visit, 12 month history viewed on the [facility] prior  to issuing an initial prescription for Schedule II or III opiod.   Chinita Pester, FNP 09/11/20 2240    Shaune Pollack, MD 09/17/20 2055

## 2020-09-18 ENCOUNTER — Encounter: Payer: Self-pay | Admitting: Student in an Organized Health Care Education/Training Program

## 2020-09-18 ENCOUNTER — Ambulatory Visit
Admission: RE | Admit: 2020-09-18 | Discharge: 2020-09-18 | Disposition: A | Discharge status: Home / Self Care | Payer: No Typology Code available for payment source | Source: Ambulatory Visit | Attending: Student in an Organized Health Care Education/Training Program | Admitting: Student in an Organized Health Care Education/Training Program

## 2020-09-18 ENCOUNTER — Ambulatory Visit (HOSPITAL_BASED_OUTPATIENT_CLINIC_OR_DEPARTMENT_OTHER)
Payer: No Typology Code available for payment source | Admitting: Student in an Organized Health Care Education/Training Program

## 2020-09-18 ENCOUNTER — Other Ambulatory Visit: Payer: Self-pay

## 2020-09-18 VITALS — BP 140/80 | HR 83 | Temp 97.2°F | Resp 18 | Ht 64.0 in | Wt 173.0 lb

## 2020-09-18 DIAGNOSIS — G894 Chronic pain syndrome: Secondary | ICD-10-CM | POA: Diagnosis not present

## 2020-09-18 DIAGNOSIS — M48062 Spinal stenosis, lumbar region with neurogenic claudication: Secondary | ICD-10-CM | POA: Insufficient documentation

## 2020-09-18 DIAGNOSIS — M5416 Radiculopathy, lumbar region: Secondary | ICD-10-CM | POA: Insufficient documentation

## 2020-09-18 MED ORDER — LIDOCAINE HCL 2 % IJ SOLN
20.0000 mL | Freq: Once | INTRAMUSCULAR | Status: AC
  Administered 2020-09-18: 400 mg
  Filled 2020-09-18: qty 40

## 2020-09-18 MED ORDER — DEXAMETHASONE SODIUM PHOSPHATE 10 MG/ML IJ SOLN
10.0000 mg | Freq: Once | INTRAMUSCULAR | Status: AC
  Administered 2020-09-18: 10 mg
  Filled 2020-09-18: qty 1

## 2020-09-18 MED ORDER — IOHEXOL 180 MG/ML  SOLN
10.0000 mL | Freq: Once | INTRAMUSCULAR | Status: AC
  Administered 2020-09-18: 10 mL via EPIDURAL

## 2020-09-18 MED ORDER — SODIUM CHLORIDE 0.9% FLUSH
2.0000 mL | Freq: Once | INTRAVENOUS | Status: AC
  Administered 2020-09-18: 2 mL

## 2020-09-18 MED ORDER — ROPIVACAINE HCL 2 MG/ML IJ SOLN
2.0000 mL | Freq: Once | INTRAMUSCULAR | Status: AC
  Administered 2020-09-18: 2 mL via EPIDURAL
  Filled 2020-09-18: qty 10

## 2020-09-18 NOTE — Progress Notes (Signed)
PROVIDER NOTE: Information contained herein reflects review and annotations entered in association with encounter. Interpretation of such information and data should be left to medically-trained personnel. Information provided to patient can be located elsewhere in the medical record under "Patient Instructions". Document created using STT-dictation technology, any transcriptional errors that may result from process are unintentional.    Patient: Stanley Baxter.  Service Category: Procedure  Provider: Edward Jolly, MD  DOB: 1941/04/27  DOS: 09/18/2020  Location: [facility]  MRN: [id number]  Setting: Ambulatory - outpatient  Referring Provider: [facility], [facility]  Type: Established Patient  Specialty: Interventional Pain Management  PCP: [facility], [facility]   Primary Reason for Visit: Interventional Pain Management Treatment. CC: Back Pain (Bilateral, right is worse)  Procedure:          Anesthesia, Analgesia, Anxiolysis:  Type: Diagnostic Inter-Laminar Epidural Steroid Injection  #1  Region: Lumbar Level: L2-3 Level. Laterality: Right-Sided         Type: Local Anesthesia  Local Anesthetic: Lidocaine 1-2%  Position: Prone with head of the table was raised to facilitate breathing.   Indications: 1. Lumbar radiculopathy (L2/3 and L3/4)   2. Spinal stenosis, lumbar region, with neurogenic claudication   3. Chronic pain syndrome    Pain Score: Pre-procedure: 10-Worst pain ever/10 Post-procedure: 8 /10   Pre-op H&P Assessment:  Mr. Hayward is a 80 y.o. (year old), male patient, seen today for interventional treatment. He  has a past surgical history that includes Hernia repair (2008). Mr. Dayhoff has a current medication list which includes the following prescription(s): acetaminophen, amlodipine, budesonide, cetirizine, cholecalciferol, diclofenac sodium, duloxetine, hydrocodone-acetaminophen, melatonin-pyridoxine, polyethylene glycol powder, prazosin,  pregabalin, sildenafil, tamsulosin, and tramadol. His primarily concern today is the Back Pain (Bilateral, right is worse)  Initial Vital Signs:  Pulse/HCG Rate: 83ECG Heart Rate: 69 Temp: (!) 97.2 F (36.2 C) Resp: 16 BP: 122/70 SpO2: 98 %  BMI: Estimated body mass index is 29.7 kg/m as calculated from the following:   Height as of this encounter: 5\' 4"  (1.626 m).   Weight as of this encounter: 173 lb (78.5 kg).  Risk Assessment: Allergies: Reviewed. He is allergic to lisinopril and duloxetine.  Allergy Precautions: None required Coagulopathies: Reviewed. None identified.  Blood-thinner therapy: None at this time Active Infection(s): Reviewed. None identified. Mr. Advani is afebrile  Site Confirmation: Mr. Ogata was asked to confirm the procedure and laterality before marking the site Procedure checklist: Completed Consent: Before the procedure and under the influence of no sedative(s), amnesic(s), or anxiolytics, the patient was informed of the treatment options, risks and possible complications. To fulfill our ethical and legal obligations, as recommended by the American Medical Association's Code of Ethics, I have informed the patient of my clinical impression; the nature and purpose of the treatment or procedure; the risks, benefits, and possible complications of the intervention; the alternatives, including doing nothing; the risk(s) and benefit(s) of the alternative treatment(s) or procedure(s); and the risk(s) and benefit(s) of doing nothing. The patient was provided information about the general risks and possible complications associated with the procedure. These may include, but are not limited to: failure to achieve desired goals, infection, bleeding, organ or nerve damage, allergic reactions, paralysis, and death. In addition, the patient was informed of those risks and complications associated to Spine-related procedures, such as failure to decrease pain; infection (i.e.:  Meningitis, epidural or intraspinal abscess); bleeding (i.e.: epidural hematoma, subarachnoid hemorrhage, or any other type of intraspinal or peri-dural bleeding); organ  or nerve damage (i.e.: Any type of peripheral nerve, nerve root, or spinal cord injury) with subsequent damage to sensory, motor, and/or autonomic systems, resulting in permanent pain, numbness, and/or weakness of one or several areas of the body; allergic reactions; (i.e.: anaphylactic reaction); and/or death. Furthermore, the patient was informed of those risks and complications associated with the medications. These include, but are not limited to: allergic reactions (i.e.: anaphylactic or anaphylactoid reaction(s)); adrenal axis suppression; blood sugar elevation that in diabetics may result in ketoacidosis or comma; water retention that in patients with history of congestive heart failure may result in shortness of breath, pulmonary edema, and decompensation with resultant heart failure; weight gain; swelling or edema; medication-induced neural toxicity; particulate matter embolism and blood vessel occlusion with resultant organ, and/or nervous system infarction; and/or aseptic necrosis of one or more joints. Finally, the patient was informed that Medicine is not an exact science; therefore, there is also the possibility of unforeseen or unpredictable risks and/or possible complications that may result in a catastrophic outcome. The patient indicated having understood very clearly. We have given the patient no guarantees and we have made no promises. Enough time was given to the patient to ask questions, all of which were answered to the patient's satisfaction. Mr. Farruggia has indicated that he wanted to continue with the procedure. Attestation: I, the ordering provider, attest that I have discussed with the patient the benefits, risks, side-effects, alternatives, likelihood of achieving goals, and potential problems during recovery for the  procedure that I have provided informed consent. Date  Time: 09/18/2020  8:42 AM  Pre-Procedure Preparation:  Monitoring: As per clinic protocol. Respiration, ETCO2, SpO2, BP, heart rate and rhythm monitor placed and checked for adequate function Safety Precautions: Patient was assessed for positional comfort and pressure points before starting the procedure. Time-out: I initiated and conducted the "Time-out" before starting the procedure, as per protocol. The patient was asked to participate by confirming the accuracy of the "Time Out" information. Verification of the correct person, site, and procedure were performed and confirmed by me, the nursing staff, and the patient. "Time-out" conducted as per Joint Commission's Universal Protocol (UP.01.01.01). Time: 0908  Description of Procedure:          Target Area: The interlaminar space, initially targeting the lower laminar border of the superior vertebral body. Approach: Paramedial approach. Area Prepped: Entire Posterior Lumbar Region DuraPrep (Iodine Povacrylex [0.7% available iodine] and Isopropyl Alcohol, 74% w/w) Safety Precautions: Aspiration looking for blood return was conducted prior to all injections. At no point did we inject any substances, as a needle was being advanced. No attempts were made at seeking any paresthesias. Safe injection practices and needle disposal techniques used. Medications properly checked for expiration dates. SDV (single dose vial) medications used. Description of the Procedure: Protocol guidelines were followed. The procedure needle was introduced through the skin, ipsilateral to the reported pain, and advanced to the target area. Bone was contacted and the needle walked caudad, until the lamina was cleared. The epidural space was identified using "loss-of-resistance technique" with 2-3 ml of PF-NaCl (0.9% NSS), in a 5cc LOR glass syringe.  Vitals:   09/18/20 0845 09/18/20 0905 09/18/20 0910 09/18/20 0915  BP:  122/70 (!) 153/79 (!) 151/75 140/80  Pulse: 83     Resp: 16 18 16 18   Temp: (!) 97.2 F (36.2 C)     SpO2: 98% 100% 100% 100%  Weight: 173 lb (78.5 kg)     Height: 5\' 4"  (1.626 m)  Start Time: 0908 hrs. End Time: 0914 hrs.  Materials:  Needle(s) Type: Epidural needle Gauge: 22G Length: 3.5-in Medication(s): Please see orders for medications and dosing details. 6.5 cc solution made of 3.5 cc of preservative-free saline, 2 cc of 0.2% ropivacaine, 1 cc of Decadron 10 mg/cc.  Imaging Guidance (Spinal):          Type of Imaging Technique: Fluoroscopy Guidance (Spinal) Indication(s): Assistance in needle guidance and placement for procedures requiring needle placement in or near specific anatomical locations not easily accessible without such assistance. Exposure Time: Please see nurses notes. Contrast: Before injecting any contrast, we confirmed that the patient did not have an allergy to iodine, shellfish, or radiological contrast. Once satisfactory needle placement was completed at the desired level, radiological contrast was injected. Contrast injected under live fluoroscopy. No contrast complications. See chart for type and volume of contrast used. Fluoroscopic Guidance: I was personally present during the use of fluoroscopy. "Tunnel Vision Technique" used to obtain the best possible view of the target area. Parallax error corrected before commencing the procedure. "Direction-depth-direction" technique used to introduce the needle under continuous pulsed fluoroscopy. Once target was reached, antero-posterior, oblique, and lateral fluoroscopic projection used confirm needle placement in all planes. Images permanently stored in EMR. Interpretation: I personally interpreted the imaging intraoperatively. Adequate needle placement confirmed in multiple planes. Appropriate spread of contrast into desired area was observed. No evidence of afferent or efferent intravascular uptake. No  intrathecal or subarachnoid spread observed. Permanent images saved into the patient's record.  Post-operative Assessment:  Post-procedure Vital Signs:  Pulse/HCG Rate: 8372 Temp: (!) 97.2 F (36.2 C) Resp: 18 BP: 140/80 SpO2: 100 %  EBL: None  Complications: No immediate post-treatment complications observed by team, or reported by patient.  Note: The patient tolerated the entire procedure well. A repeat set of vitals were taken after the procedure and the patient was kept under observation following institutional policy, for this type of procedure. Post-procedural neurological assessment was performed, showing return to baseline, prior to discharge. The patient was provided with post-procedure discharge instructions, including a section on how to identify potential problems. Should any problems arise concerning this procedure, the patient was given instructions to immediately contact us, at any time, without hesitation. In any case, we plan to contact the patient by telephone for a follow-up status report regarding this interventional procedure.  Comments:  No additional relevant information.  Plan of Care  Orders:  Orders Placed This Encounter  Procedures  . DG PAIN CLINIC C-ARM 1-60 MIN NO REPORT    Intraoperative interpretation by procedural physician at [facility].    Standing Status:   Standing    Number of Occurrences:   1    Order Specific Question:   Reason for exam:    Answer:   Assistance in needle guidance and placement for procedures requiring needle placement in or near specific anatomical locations not easily accessible without such assistance.   Medications ordered for procedure: Meds ordered this encounter  Medications  . iohexol (OMNIPAQUE) 180 MG/ML injection 10 mL    Must be Myelogram-compatible. If not available, you may substitute with a water-soluble, non-ionic, hypoallergenic, myelogram-compatible radiological contrast medium.  Marland Kitchen. lidocaine  (XYLOCAINE) 2 % (with pres) injection 400 mg  . ropivacaine (PF) 2 mg/mL (0.2%) (NAROPIN) injection 2 mL  . sodium chloride flush (NS) 0.9 % injection 2 mL  . dexamethasone (DECADRON) injection 10 mg   Medications administered: We administered iohexol, lidocaine, ropivacaine (PF) 2 mg/mL (0.2%),  sodium chloride flush, and dexamethasone.  See the medical record for exact dosing, route, and time of administration.  Follow-up plan:   Return in about 4 weeks (around 10/16/2020) for Post Procedure Evaluation, in person.      L2/3 ESI 11/27/19: 6cc injected, 09/18/20: 6.5 cc injected     Recent Visits Date Type Provider Dept  09/10/20 Office Visit Edward Jolly, MD [facility]  Showing recent visits within past 90 days and meeting all other requirements Today's Visits Date Type Provider Dept  09/18/20 Procedure visit Edward Jolly, MD [facility]  Showing today's visits and meeting all other requirements Future Appointments Date Type Provider Dept  11/14/20 Appointment Edward Jolly, MD [facility]  Showing future appointments within next 90 days and meeting all other requirements  Disposition: Discharge home  Discharge (Date  Time): 09/18/2020; 0930 hrs.   Primary Care Physician: [facility] Location: [facility] Note by: Edward Jolly, MD Date: 09/18/2020; Time: 9:42 AM  Disclaimer:  Medicine is not an exact science. The only guarantee in medicine is that nothing is guaranteed. It is important to note that the decision to proceed with this intervention was based on the information collected from the patient. The Data and conclusions were drawn from the patient's questionnaire, the interview, and the physical examination. Because the information was provided in large part by the patient, it cannot be guaranteed that it has not been purposely or unconsciously manipulated. Every effort has been made to obtain as much  relevant data as possible for this evaluation. It is important to note that the conclusions that lead to this procedure are derived in large part from the available data. Always take into account that the treatment will also be dependent on availability of resources and existing treatment guidelines, considered by other Pain Management Practitioners as being common knowledge and practice, at the time of the intervention. For Medico-Legal purposes, it is also important to point out that variation in procedural techniques and pharmacological choices are the acceptable norm. The indications, contraindications, technique, and results of the above procedure should only be interpreted and judged by a Board-Certified Interventional Pain Specialist with extensive familiarity and expertise in the same exact procedure and technique.

## 2020-09-18 NOTE — Progress Notes (Signed)
Safety precautions to be maintained throughout the outpatient stay will include: orient to surroundings, keep bed in low position, maintain call bell within reach at all times, provide assistance with transfer out of bed and ambulation.  

## 2020-09-18 NOTE — Patient Instructions (Signed)
Pain Management Discharge Instructions  General Discharge Instructions :  If you need to reach your doctor call: Monday-Friday 8:00 am - 4:00 pm at [phone] or toll free [phone].  After clinic hours [phone] to have operator reach doctor.  Bring all of your medication bottles to all your appointments in the pain clinic.  To cancel or reschedule your appointment with Pain Management please remember to call 24 hours in advance to avoid a fee.  Refer to the educational materials which you have been given on: General Risks, I had my Procedure. Discharge Instructions, Post Sedation.  Post Procedure Instructions:  The drugs you were given will stay in your system until tomorrow, so for the next 24 hours you should not drive, make any legal decisions or drink any alcoholic beverages.  You may eat anything you prefer, but it is better to start with liquids then soups and crackers, and gradually work up to solid foods.  Please notify your doctor immediately if you have any unusual bleeding, trouble breathing or pain that is not related to your normal pain.  Depending on the type of procedure that was done, some parts of your body may feel week and/or numb.  This usually clears up by tonight or the next day.  Walk with the use of an assistive device or accompanied by an adult for the 24 hours.  You may use ice on the affected area for the first 24 hours.  Put ice in a Ziploc bag and cover with a towel and place against area 15 minutes on 15 minutes off.  You may switch to heat after 24 hours.Epidural Steroid Injection Patient Information  Description: The epidural space surrounds the nerves as they exit the spinal cord.  In some patients, the nerves can be compressed and inflamed by a bulging disc or a tight spinal canal (spinal stenosis).  By injecting steroids into the epidural space, we can bring irritated nerves into direct contact with a potentially helpful medication.  These  steroids act directly on the irritated nerves and can reduce swelling and inflammation which often leads to decreased pain.  Epidural steroids may be injected anywhere along the spine and from the neck to the low back depending upon the location of your pain.   After numbing the skin with local anesthetic (like Novocaine), a small needle is passed into the epidural space slowly.  You may experience a sensation of pressure while this is being done.  The entire block usually last less than 10 minutes.  Conditions which may be treated by epidural steroids:   Low back and leg pain  Neck and arm pain  Spinal stenosis  Post-laminectomy syndrome  Herpes zoster (shingles) pain  Pain from compression fractures  Preparation for the injection:  1. Do not eat any solid food or dairy products within 8 hours of your appointment.  2. You may drink clear liquids up to 3 hours before appointment.  Clear liquids include water, black coffee, juice or soda.  No milk or cream please. 3. You may take your regular medication, including pain medications, with a sip of water before your appointment  Diabetics should hold regular insulin (if taken separately) and take 1/2 normal NPH dos the morning of the procedure.  Carry some sugar containing items with you to your appointment. 4. A driver must accompany you and be prepared to drive you home after your procedure.  5. Bring all your current medications with your. 6. An IV may be inserted and   sedation may be given at the discretion of the physician.   7. A blood pressure cuff, EKG and other monitors will often be applied during the procedure.  Some patients may need to have extra oxygen administered for a short period. 8. You will be asked to provide medical information, including your allergies, prior to the procedure.  We must know immediately if you are taking blood thinners (like Coumadin/Warfarin)  Or if you are allergic to IV iodine contrast (dye). We must  know if you could possible be pregnant.  Possible side-effects:  Bleeding from needle site  Infection (rare, may require surgery)  Nerve injury (rare)  Numbness & tingling (temporary)  Difficulty urinating (rare, temporary)  Spinal headache ( a headache worse with upright posture)  Light -headedness (temporary)  Pain at injection site (several days)  Decreased blood pressure (temporary)  Weakness in arm/leg (temporary)  Pressure sensation in back/neck (temporary)  Call if you experience:  Fever/chills associated with headache or increased back/neck pain.  Headache worsened by an upright position.  New onset weakness or numbness of an extremity below the injection site  Hives or difficulty breathing (go to the emergency room)  Inflammation or drainage at the infection site  Severe back/neck pain  Any new symptoms which are concerning to you  Please note:  Although the local anesthetic injected can often make your back or neck feel good for several hours after the injection, the pain will likely return.  It takes 3-7 days for steroids to work in the epidural space.  You may not notice any pain relief for at least that one week.  If effective, we will often do a series of three injections spaced 3-6 weeks apart to maximally decrease your pain.  After the initial series, we generally will wait several months before considering a repeat injection of the same type.  If you have any questions, please call [phone] [facility] 

## 2020-09-19 ENCOUNTER — Telehealth: Payer: Self-pay | Admitting: *Deleted

## 2020-09-19 NOTE — Telephone Encounter (Signed)
Spoke with patient re; procedure on yesterday, no questions or concerns.  Next appt time confirmed.

## 2020-09-30 ENCOUNTER — Ambulatory Visit
Payer: No Typology Code available for payment source | Admitting: Student in an Organized Health Care Education/Training Program

## 2020-11-14 ENCOUNTER — Ambulatory Visit
Payer: No Typology Code available for payment source | Attending: Student in an Organized Health Care Education/Training Program | Admitting: Student in an Organized Health Care Education/Training Program

## 2020-11-14 ENCOUNTER — Other Ambulatory Visit: Payer: Self-pay

## 2020-11-14 ENCOUNTER — Encounter: Payer: Self-pay | Admitting: Student in an Organized Health Care Education/Training Program

## 2020-11-14 VITALS — BP 116/71 | HR 90 | Temp 96.4°F | Resp 18 | Ht 64.0 in | Wt 177.0 lb

## 2020-11-14 DIAGNOSIS — M48061 Spinal stenosis, lumbar region without neurogenic claudication: Secondary | ICD-10-CM | POA: Diagnosis not present

## 2020-11-14 DIAGNOSIS — G894 Chronic pain syndrome: Secondary | ICD-10-CM | POA: Insufficient documentation

## 2020-11-14 DIAGNOSIS — M5416 Radiculopathy, lumbar region: Secondary | ICD-10-CM | POA: Diagnosis present

## 2020-11-14 DIAGNOSIS — M48062 Spinal stenosis, lumbar region with neurogenic claudication: Secondary | ICD-10-CM | POA: Insufficient documentation

## 2020-11-14 DIAGNOSIS — G8929 Other chronic pain: Secondary | ICD-10-CM | POA: Diagnosis present

## 2020-11-14 NOTE — Patient Instructions (Signed)
Epidural Steroid Injection Patient Information  Description: The epidural space surrounds the nerves as they exit the spinal cord.  In some patients, the nerves can be compressed and inflamed by a bulging disc or a tight spinal canal (spinal stenosis).  By injecting steroids into the epidural space, we can bring irritated nerves into direct contact with a potentially helpful medication.  These steroids act directly on the irritated nerves and can reduce swelling and inflammation which often leads to decreased pain.  Epidural steroids may be injected anywhere along the spine and from the neck to the low back depending upon the location of your pain.   After numbing the skin with local anesthetic (like Novocaine), a small needle is passed into the epidural space slowly.  You may experience a sensation of pressure while this is being done.  The entire block usually last less than 10 minutes.  Conditions which may be treated by epidural steroids:  Low back and leg pain Neck and arm pain Spinal stenosis Post-laminectomy syndrome Herpes zoster (shingles) pain Pain from compression fractures  Preparation for the injection:  Do not eat any solid food or dairy products within 8 hours of your appointment.  You may drink clear liquids up to 3 hours before appointment.  Clear liquids include water, black coffee, juice or soda.  No milk or cream please. You may take your regular medication, including pain medications, with a sip of water before your appointment  Diabetics should hold regular insulin (if taken separately) and take 1/2 normal NPH dos the morning of the procedure.  Carry some sugar containing items with you to your appointment. A driver must accompany you and be prepared to drive you home after your procedure.  Bring all your current medications with your. An IV may be inserted and sedation may be given at the discretion of the physician.   A blood pressure cuff, EKG and other monitors will  often be applied during the procedure.  Some patients may need to have extra oxygen administered for a short period. You will be asked to provide medical information, including your allergies, prior to the procedure.  We must know immediately if you are taking blood thinners (like Coumadin/Warfarin)  Or if you are allergic to IV iodine contrast (dye). We must know if you could possible be pregnant.  Possible side-effects: Bleeding from needle site Infection (rare, may require surgery) Nerve injury (rare) Numbness & tingling (temporary) Difficulty urinating (rare, temporary) Spinal headache ( a headache worse with upright posture) Light -headedness (temporary) Pain at injection site (several days) Decreased blood pressure (temporary) Weakness in arm/leg (temporary) Pressure sensation in back/neck (temporary)  Call if you experience: Fever/chills associated with headache or increased back/neck pain. Headache worsened by an upright position. New onset weakness or numbness of an extremity below the injection site Hives or difficulty breathing (go to the emergency room) Inflammation or drainage at the infection site Severe back/neck pain Any new symptoms which are concerning to you  Please note:  Although the local anesthetic injected can often make your back or neck feel good for several hours after the injection, the pain will likely return.  It takes 3-7 days for steroids to work in the epidural space.  You may not notice any pain relief for at least that one week.  If effective, we will often do a series of three injections spaced 3-6 weeks apart to maximally decrease your pain.  After the initial series, we generally will wait several months before   considering a repeat injection of the same type.  If you have any questions, please call [phone] [facility] 

## 2020-11-14 NOTE — Progress Notes (Signed)
Safety precautions to be maintained throughout the outpatient stay will include: orient to surroundings, keep bed in low position, maintain call bell within reach at all times, provide assistance with transfer out of bed and ambulation.  

## 2020-11-14 NOTE — Progress Notes (Signed)
PROVIDER NOTE: Information contained herein reflects review and annotations entered in association with encounter. Interpretation of such information and data should be left to medically-trained personnel. Information provided to patient can be located elsewhere in the medical record under "Patient Instructions". Document created using STT-dictation technology, any transcriptional errors that may result from process are unintentional.    Patient: Stanley Baxter.  Service Category: E/M  Provider: Gillis Santa, MD  DOB: 1940-11-09  DOS: 11/14/2020  Specialty: Interventional Pain Management  MRN: [id number]  Setting: Ambulatory outpatient  PCP: [facility]  Type: Established Patient    Referring Provider: [facility]  Location: Office  Delivery: Face-to-face     HPI  Mr. Stanley Baxter., a 80 y.o. year old male, is here today because of his Lumbar radiculopathy [M54.16]. Stanley Baxter primary complain today is Back Pain (lower) Last encounter: My last encounter with him was on 09/18/2020. Pertinent problems: Stanley Baxter has Lumbar degenerative disc disease; Chronic radicular lumbar pain; Spinal stenosis, lumbar region, with neurogenic claudication; Lumbar facet arthropathy; Chronic pain syndrome; and Lumbar foraminal stenosis on their pertinent problem list. Pain Assessment: Severity of Chronic pain, Neuropathic pain is reported as a 5 /10. Location: Back Lower/both legs to the feet. Onset: More than a month ago. Quality: Sharp. Timing: Constant. Modifying factor(s): chiropractor, LESI. Vitals:  height is '5\' 4"'  (1.626 m) and weight is 177 lb (80.3 kg). His temporal temperature is 96.4 F (35.8 C) (abnormal). His blood pressure is 116/71 and his pulse is 90. His respiration is 18 and oxygen saturation is 98%.   Reason for encounter: post-procedure assessment.     Post-Procedure Evaluation  Procedure (09/18/2020):   Type: Diagnostic Inter-Laminar Epidural Steroid  Injection  #1 in 2022 Region: Lumbar Level: L2-3 Level. Laterality: Right-Sided     Anxiolysis: Please see nurses note.  Effectiveness during initial hour after procedure (Ultra-Short Term Relief): 70%  Local anesthetic used: Long-acting (4-6 hours) Effectiveness: Defined as any analgesic benefit obtained secondary to the administration of local anesthetics. This carries significant diagnostic value as to the etiological location, or anatomical origin, of the pain. Duration of benefit is expected to coincide with the duration of the local anesthetic used.  Effectiveness during initial 4-6 hours after procedure (Short-Term Relief): 70%  Long-term benefit: Defined as any relief past the pharmacologic duration of the local anesthetics.  Effectiveness past the initial 6 hours after procedure (Long-Term Relief): 50%  Benefits, current: Defined as benefit present at the time of this evaluation.   Analgesia:  50% improved Function: Somewhat improved ROM: Somewhat improved   ROS  Constitutional: Denies any fever or chills Gastrointestinal: No reported hemesis, hematochezia, vomiting, or acute GI distress Musculoskeletal: Denies any acute onset joint swelling, redness, loss of ROM, or weakness Neurological: No reported episodes of acute onset apraxia, aphasia, dysarthria, agnosia, amnesia, paralysis, loss of coordination, or loss of consciousness  Medication Review  Budesonide, Cholecalciferol, DULoxetine, HYDROcodone-acetaminophen, Melatonin-Pyridoxine, acetaminophen, amLODipine, cetirizine, diclofenac Sodium, polyethylene glycol powder, prazosin, pregabalin, sildenafil, tamsulosin, and traMADol  History Review  Allergy: Stanley Baxter is allergic to lisinopril and duloxetine. Drug: Stanley Baxter  reports no history of drug use. Alcohol:  reports previous alcohol use. Tobacco:  reports that he has never smoked. He has never used smokeless tobacco. Social: Stanley Baxter  reports that he has never  smoked. He has never used smokeless tobacco. He reports previous alcohol use. He reports that he does not use drugs. Medical:  has a past medical history of  Abnormal echocardiogram (2016), Abscess after procedure, Anxiety, DDD (degenerative disc disease), lumbar (09/20/2013), Hyperlipidemia, unspecified (09/20/2013), and Hypertension. Surgical: Stanley Baxter  has a past surgical history that includes Hernia repair (2008). Family: family history includes Cancer in his mother; Liver disease in his father.  Laboratory Chemistry Profile   Renal Lab Results  Component Value Date   BUN 15 09/25/2019   CREATININE 0.96 09/25/2019   GFRAA >60 09/25/2019   GFRNONAA >60 09/25/2019    Hepatic Lab Results  Component Value Date   AST 38 06/20/2016   ALT 27 06/20/2016   ALBUMIN 4.2 06/20/2016   ALKPHOS 69 06/20/2016   LIPASE 33 06/19/2016    Electrolytes Lab Results  Component Value Date   NA 139 09/25/2019   K 3.9 09/25/2019   CL 103 09/25/2019   CALCIUM 9.0 09/25/2019    Bone No results found for: VD25OH, [id number], [id number], [id number], 25OHVITD1, 25OHVITD2, 25OHVITD3, TESTOFREE, TESTOSTERONE  Inflammation (CRP: Acute Phase) (ESR: Chronic Phase) Lab Results  Component Value Date   LATICACIDVEN 1.0 06/20/2016         Note: Above Lab results reviewed.  EXAM: CT LUMBAR SPINE WITHOUT CONTRAST   TECHNIQUE: Multidetector CT imaging of the lumbar spine was performed without intravenous contrast administration. Multiplanar CT image reconstructions were also generated.   COMPARISON:  X-ray 03/03/2018   FINDINGS: Segmentation: 5 lumbar type vertebrae.   Alignment: 3 mm retrolisthesis L2 on L3. No evidence of traumatic listhesis. Facet joint alignment is maintained.   Vertebrae: Vertebral body heights are maintained without acute fracture. No bone lesion.   Paraspinal and other soft tissues: 5 mm rounded metallic foreign body within the subcutaneous soft tissues overlying the  medial aspect of the left gluteal musculature (series 6, image 130). Mild volume of stool throughout the visualized:Marland Kitchen Mild diffuse urinary bladder wall thickening.   Disc levels:   T12-L1: No large disc protrusion. No evidence of foraminal or canal stenosis.   L1-L2: No large disc protrusion. No evidence of foraminal or canal stenosis.   L2-L3: Slight retrolisthesis with associated diffuse disc bulge. Mild bilateral facet arthrosis and buckling of the ligamentum flavum contributes to moderate canal stenosis and moderate bilateral foraminal stenosis.   L3-L4: Diffuse disc bulge with mild bilateral facet arthrosis and buckling of the ligamentum flavum contribute to moderate canal stenosis and mild bilateral foraminal stenosis.   L4-L5: Diffuse disc bulge with mild bilateral facet arthrosis and ligamentum flavum buckling result in borderline/mild canal stenosis and mild bilateral foraminal stenosis.   L5-S1: Mild diffuse disc bulge and mild bilateral facet arthrosis without evidence of foraminal or canal stenosis.   IMPRESSION: 1. 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 Findings are most pronounced at L2-3 where there it appears to be moderate canal stenosis with moderate bilateral foraminal stenosis. There is also moderate canal stenosis at the L3-4 level. 2. Mild diffuse urinary bladder wall thickening. Correlate for cystitis. 3. 5 mm rounded metallic foreign body within the subcutaneous soft tissues overlying the medial aspect of the left gluteal musculature.    Physical Exam  General appearance: Well nourished, well developed, and well hydrated. In no apparent acute distress Mental status: Alert, oriented x 3 (person, place, & time)       Respiratory: No evidence of acute respiratory distress Eyes: PERLA Vitals: BP 116/71   Pulse 90   Temp (!) 96.4 F (35.8 C) (Temporal)   Resp 18   Ht '5\' 4"'  (1.626 m)   Wt 177 lb (  80.3  kg)   SpO2 98%   BMI 30.38 kg/m  BMI: Estimated body mass index is 30.38 kg/m as calculated from the following:   Height as of this encounter: '5\' 4"'  (1.626 m).   Weight as of this encounter: 177 lb (80.3 kg). Ideal: Ideal body weight: 59.2 kg (130 lb 8.2 oz) Adjusted ideal body weight: 67.6 kg (149 lb 1.7 oz)    Lumbar Exam  Skin & Axial Inspection: No masses, redness, or swelling Alignment: Symmetrical Functional ROM: Unrestricted ROM       Stability: No instability detected Muscle Tone/Strength: Functionally intact. No obvious neuro-muscular anomalies detected. Sensory (Neurological): Dermatomal pain pattern and musculoskeletal Palpation: No palpable anomalies       Provocative Tests: Hyperextension/rotation test: (+) bilaterally for facet joint pain. Lumbar quadrant test (Kemp's test): (+) bilateral for foraminal stenosis Lateral bending test: (+) ipsilateral radicular pain, bilaterally. Positive for bilateral foraminal stenosis. Patrick's Maneuver: deferred today                   FABER* test: deferred today                   S-I anterior distraction/compression test: deferred today         S-I lateral compression test: deferred today         S-I Thigh-thrust test: deferred today         S-I Gaenslen's test: deferred today         *(Flexion, ABduction and External Rotation)   Gait & Posture Assessment  Ambulation: Patient ambulates using a cane Gait: Limited. Using assistive device to ambulate Posture: Difficulty standing up straight, due to pain    Lower Extremity Exam      Side: Right lower extremity   Side: Left lower extremity  Stability: No instability observed           Stability: No instability observed          Skin & Extremity Inspection: Skin color, temperature, and hair growth are WNL. No peripheral edema or cyanosis. No masses, redness, swelling, asymmetry, or associated skin lesions. No contractures.   Skin & Extremity Inspection: Skin color, temperature, and  hair growth are WNL. No peripheral edema or cyanosis. No masses, redness, swelling, asymmetry, or associated skin lesions. No contractures.  Functional ROM: Pain restricted ROM for hip and knee joints            Functional ROM: Pain restricted ROM for hip and knee joints          Muscle Tone/Strength: Functionally intact. No obvious neuro-muscular anomalies detected.   Muscle Tone/Strength: Functionally intact. No obvious neuro-muscular anomalies detected.  Sensory (Neurological): Unimpaired         Sensory (Neurological): Unimpaired        DTR: Patellar: deferred today Achilles: deferred today Plantar: deferred today   DTR: Patellar: deferred today Achilles: deferred today Plantar: deferred today  Palpation: No palpable anomalies   Palpation: No palpable anomalies      Assessment   Status Diagnosis  Responding Responding Responding 1. Lumbar radiculopathy (L2/3 and L3/4)   2. Spinal stenosis, lumbar region, with neurogenic claudication   3. Lumbar foraminal stenosis   4. Chronic radicular lumbar pain   5. Chronic pain syndrome       Plan of Care    Stanley Baxter. has a current medication list which includes the following long-term medication(s): amlodipine, budesonide, cetirizine, duloxetine, prazosin, pregabalin, and sildenafil.  Pharmacotherapy (Medications  Ordered): No orders of the defined types were placed in this encounter.  Repeat ESI #2 for the year.  Orders:  Orders Placed This Encounter  Procedures   Lumbar Epidural Injection    Standing Status:   Future    Standing Expiration Date:   12/15/2020    Scheduling Instructions:     Procedure: Interlaminar Lumbar Epidural Steroid injection (LESI)            Laterality: Midline     Sedation: Patient's choice.     Timeframe: ASAA    Order Specific Question:   Where will this procedure be performed?    Answer:   [facility]   Follow-up plan:   Return in about 8 weeks (around 01/09/2021) for  Medication Management, in person.     L2/3 ESI 11/27/19: 6cc injected, 09/18/20: 6.5 cc injected      Recent Visits Date Type Provider Dept  09/18/20 Procedure visit Gillis Santa, MD [facility]  09/10/20 Office Visit Gillis Santa, MD [facility]  Showing recent visits within past 90 days and meeting all other requirements Today's Visits Date Type Provider Dept  11/14/20 Office Visit Gillis Santa, MD [facility]  Showing today's visits and meeting all other requirements Future Appointments No visits were found meeting these conditions. Showing future appointments within next 90 days and meeting all other requirements  I discussed the assessment and treatment plan with the patient. The patient was provided an opportunity to ask questions and all were answered. The patient agreed with the plan and demonstrated an understanding of the instructions.  Patient advised to call back or seek an in-person evaluation if the symptoms or condition worsens.  Duration of encounter: 66mnutes.  Note by: BGillis Santa MD Date: 11/14/2020; Time: 11:34 AM

## 2021-01-09 ENCOUNTER — Encounter
Payer: No Typology Code available for payment source | Admitting: Student in an Organized Health Care Education/Training Program

## 2021-04-27 ENCOUNTER — Ambulatory Visit: Payer: No Typology Code available for payment source
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
## 2023-01-19 ENCOUNTER — Other Ambulatory Visit: Payer: Self-pay | Admitting: Internal Medicine

## 2023-01-19 ENCOUNTER — Ambulatory Visit
Admission: RE | Admit: 2023-01-19 | Discharge: 2023-01-19 | Disposition: A | Discharge status: Home / Self Care | Payer: TRICARE For Life (TFL) | Source: Ambulatory Visit | Attending: Internal Medicine

## 2023-01-19 DIAGNOSIS — M545 Low back pain, unspecified: Secondary | ICD-10-CM

## 2023-01-19 DIAGNOSIS — M25562 Pain in left knee: Secondary | ICD-10-CM

## 2023-07-12 ENCOUNTER — Other Ambulatory Visit: Payer: Self-pay | Admitting: Internal Medicine

## 2023-07-12 DIAGNOSIS — K5909 Other constipation: Secondary | ICD-10-CM

## 2023-07-12 DIAGNOSIS — K6289 Other specified diseases of anus and rectum: Secondary | ICD-10-CM

## 2023-07-26 ENCOUNTER — Ambulatory Visit
Admission: RE | Admit: 2023-07-26 | Discharge: 2023-07-26 | Disposition: A | Discharge status: Home / Self Care | Source: Ambulatory Visit | Attending: Internal Medicine | Admitting: Internal Medicine

## 2023-07-26 DIAGNOSIS — K6289 Other specified diseases of anus and rectum: Secondary | ICD-10-CM | POA: Insufficient documentation

## 2023-07-26 DIAGNOSIS — K5909 Other constipation: Secondary | ICD-10-CM | POA: Insufficient documentation

## 2023-07-26 LAB — POCT I-STAT CREATININE: Creatinine, Ser: 1.1 mg/dL (ref 0.61–1.24)

## 2023-07-26 MED ORDER — IOHEXOL 300 MG/ML  SOLN
100.0000 mL | Freq: Once | INTRAMUSCULAR | Status: AC | PRN
  Administered 2023-07-26: 100 mL via INTRAVENOUS
